# Patient Record
Sex: Female | Born: 1950
Health system: Southern US, Community
[De-identification: ages and names within clinical notes are randomized; demographics above are authoritative.]

## PROBLEM LIST (undated history)

## (undated) DIAGNOSIS — M545 Low back pain, unspecified: Secondary | ICD-10-CM

## (undated) DIAGNOSIS — E079 Disorder of thyroid, unspecified: Secondary | ICD-10-CM

## (undated) DIAGNOSIS — C4490 Unspecified malignant neoplasm of skin, unspecified: Secondary | ICD-10-CM

## (undated) DIAGNOSIS — I10 Essential (primary) hypertension: Secondary | ICD-10-CM

## (undated) DIAGNOSIS — K219 Gastro-esophageal reflux disease without esophagitis: Secondary | ICD-10-CM

## (undated) HISTORY — DX: Unspecified malignant neoplasm of skin, unspecified: C44.90

## (undated) HISTORY — PX: TUBAL LIGATION: SHX77

## (undated) HISTORY — PX: BREAST BIOPSY: SHX20

---

## 2004-02-24 ENCOUNTER — Ambulatory Visit (HOSPITAL_COMMUNITY): Admission: RE | Admit: 2004-02-24 | Discharge: 2004-02-24 | Payer: Self-pay | Admitting: Family Medicine

## 2006-04-16 ENCOUNTER — Ambulatory Visit (HOSPITAL_COMMUNITY): Admission: RE | Admit: 2006-04-16 | Discharge: 2006-04-16 | Payer: Self-pay | Admitting: Internal Medicine

## 2006-12-26 ENCOUNTER — Ambulatory Visit (HOSPITAL_COMMUNITY): Admission: RE | Admit: 2006-12-26 | Discharge: 2006-12-26 | Payer: Self-pay | Admitting: Family Medicine

## 2007-12-01 ENCOUNTER — Ambulatory Visit (HOSPITAL_COMMUNITY): Admission: RE | Admit: 2007-12-01 | Discharge: 2007-12-01 | Payer: Self-pay | Admitting: Family Medicine

## 2008-06-24 ENCOUNTER — Ambulatory Visit (HOSPITAL_COMMUNITY): Admission: RE | Admit: 2008-06-24 | Discharge: 2008-06-24 | Payer: Self-pay | Admitting: Family Medicine

## 2009-09-01 ENCOUNTER — Inpatient Hospital Stay (HOSPITAL_COMMUNITY): Admission: AD | Admit: 2009-09-01 | Discharge: 2009-09-02 | Payer: Self-pay | Admitting: Internal Medicine

## 2009-09-01 ENCOUNTER — Ambulatory Visit: Payer: Self-pay | Admitting: Gastroenterology

## 2009-09-02 ENCOUNTER — Ambulatory Visit: Payer: Self-pay | Admitting: Gastroenterology

## 2009-09-02 ENCOUNTER — Encounter: Payer: Self-pay | Admitting: Gastroenterology

## 2009-09-30 ENCOUNTER — Encounter: Payer: Self-pay | Admitting: Gastroenterology

## 2009-10-03 ENCOUNTER — Encounter (INDEPENDENT_AMBULATORY_CARE_PROVIDER_SITE_OTHER): Payer: Self-pay | Admitting: *Deleted

## 2009-11-06 ENCOUNTER — Encounter: Payer: Self-pay | Admitting: Gastroenterology

## 2011-03-12 LAB — CBC
HCT: 24.5 % — ABNORMAL LOW (ref 36.0–46.0)
Hemoglobin: 8.4 g/dL — ABNORMAL LOW (ref 12.0–15.0)
Hemoglobin: 8.6 g/dL — ABNORMAL LOW (ref 12.0–15.0)
Hemoglobin: 8.9 g/dL — ABNORMAL LOW (ref 12.0–15.0)
Hemoglobin: 9.1 g/dL — ABNORMAL LOW (ref 12.0–15.0)
MCHC: 34.7 g/dL (ref 30.0–36.0)
MCHC: 35.2 g/dL (ref 30.0–36.0)
MCV: 90.7 fL (ref 78.0–100.0)
Platelets: 296 10*3/uL (ref 150–400)
RBC: 2.72 MIL/uL — ABNORMAL LOW (ref 3.87–5.11)
RDW: 15.3 % (ref 11.5–15.5)
RDW: 15.5 % (ref 11.5–15.5)
RDW: 15.8 % — ABNORMAL HIGH (ref 11.5–15.5)
WBC: 7.6 10*3/uL (ref 4.0–10.5)
WBC: 9 10*3/uL (ref 4.0–10.5)
WBC: 9.1 10*3/uL (ref 4.0–10.5)

## 2011-03-12 LAB — CROSSMATCH

## 2011-03-12 LAB — DIFFERENTIAL
Basophils Absolute: 0.1 10*3/uL (ref 0.0–0.1)
Basophils Absolute: 0.1 10*3/uL (ref 0.0–0.1)
Basophils Absolute: 0.1 10*3/uL (ref 0.0–0.1)
Basophils Absolute: 0.1 10*3/uL (ref 0.0–0.1)
Basophils Relative: 1 % (ref 0–1)
Eosinophils Absolute: 0.3 10*3/uL (ref 0.0–0.7)
Eosinophils Absolute: 0.3 10*3/uL (ref 0.0–0.7)
Eosinophils Relative: 4 % (ref 0–5)
Eosinophils Relative: 5 % (ref 0–5)
Lymphocytes Relative: 25 % (ref 12–46)
Lymphocytes Relative: 31 % (ref 12–46)
Lymphocytes Relative: 33 % (ref 12–46)
Lymphs Abs: 1.9 10*3/uL (ref 0.7–4.0)
Lymphs Abs: 2.3 10*3/uL (ref 0.7–4.0)
Lymphs Abs: 2.3 10*3/uL (ref 0.7–4.0)
Lymphs Abs: 2.3 10*3/uL (ref 0.7–4.0)
Monocytes Absolute: 0.3 10*3/uL (ref 0.1–1.0)
Monocytes Relative: 6 % (ref 3–12)
Monocytes Relative: 6 % (ref 3–12)
Neutro Abs: 3.3 10*3/uL (ref 1.7–7.7)
Neutro Abs: 4.3 10*3/uL (ref 1.7–7.7)
Neutro Abs: 5.9 10*3/uL (ref 1.7–7.7)
Neutrophils Relative %: 57 % (ref 43–77)

## 2011-03-12 LAB — BASIC METABOLIC PANEL
BUN: 11 mg/dL (ref 6–23)
CO2: 25 mEq/L (ref 19–32)
Calcium: 8.5 mg/dL (ref 8.4–10.5)
Calcium: 8.6 mg/dL (ref 8.4–10.5)
Chloride: 108 mEq/L (ref 96–112)
Creatinine, Ser: 0.93 mg/dL (ref 0.4–1.2)
Creatinine, Ser: 1.02 mg/dL (ref 0.4–1.2)
GFR calc Af Amer: >60 mL/min (ref >60)
GFR calc Af Amer: >60 mL/min (ref >60)
GFR calc non Af Amer: >60 mL/min (ref >60)
Glucose, Bld: 97 mg/dL (ref 70–99)
Potassium: 3.4 mEq/L — ABNORMAL LOW (ref 3.5–5.1)
Potassium: 4.2 mEq/L (ref 3.5–5.1)
Sodium: 138 mEq/L (ref 135–145)

## 2011-03-12 LAB — BRAIN NATRIURETIC PEPTIDE: Pro B Natriuretic peptide (BNP): <30 pg/mL (ref 0.0–100.0)

## 2011-03-12 LAB — ABO/RH: ABO/RH(D): A POS

## 2011-03-12 LAB — URINALYSIS, MICROSCOPIC ONLY
Glucose, UA: NEGATIVE mg/dL
Ketones, ur: NEGATIVE mg/dL
Protein, ur: NEGATIVE mg/dL
Specific Gravity, Urine: 1.015 (ref 1.005–1.030)
Urobilinogen, UA: 0.2 mg/dL (ref 0.0–1.0)
pH: 5 (ref 5.0–8.0)

## 2011-03-12 LAB — MAGNESIUM: Magnesium: 1.8 mg/dL (ref 1.5–2.5)

## 2011-03-12 LAB — CARDIAC PANEL(CRET KIN+CKTOT+MB+TROPI)
CK, MB: 1.8 ng/mL (ref 0.3–4.0)
Relative Index: INVALID (ref 0.0–2.5)
Total CK: 75 U/L (ref 7–177)

## 2011-03-12 LAB — PROTIME-INR: INR: 0.9 (ref 0.00–1.49)

## 2011-03-12 LAB — TSH: TSH: 2.366 u[IU]/mL (ref 0.350–4.500)

## 2011-07-05 ENCOUNTER — Other Ambulatory Visit: Payer: Self-pay | Admitting: Obstetrics and Gynecology

## 2011-07-13 ENCOUNTER — Other Ambulatory Visit: Payer: Self-pay | Admitting: Obstetrics and Gynecology

## 2011-07-13 DIAGNOSIS — R928 Other abnormal and inconclusive findings on diagnostic imaging of breast: Secondary | ICD-10-CM

## 2011-07-20 ENCOUNTER — Ambulatory Visit
Admission: RE | Admit: 2011-07-20 | Discharge: 2011-07-20 | Disposition: A | Discharge status: Home / Self Care | Payer: BC Managed Care – PPO | Source: Ambulatory Visit | Attending: Obstetrics and Gynecology | Admitting: Obstetrics and Gynecology

## 2011-07-20 DIAGNOSIS — R928 Other abnormal and inconclusive findings on diagnostic imaging of breast: Secondary | ICD-10-CM

## 2011-12-21 ENCOUNTER — Other Ambulatory Visit: Payer: Self-pay | Admitting: Obstetrics and Gynecology

## 2011-12-21 DIAGNOSIS — N649 Disorder of breast, unspecified: Secondary | ICD-10-CM

## 2012-01-12 ENCOUNTER — Ambulatory Visit
Admission: RE | Admit: 2012-01-12 | Discharge: 2012-01-12 | Disposition: A | Discharge status: Home / Self Care | Payer: BC Managed Care – PPO | Source: Ambulatory Visit | Attending: Obstetrics and Gynecology | Admitting: Obstetrics and Gynecology

## 2012-01-12 DIAGNOSIS — N649 Disorder of breast, unspecified: Secondary | ICD-10-CM

## 2012-01-21 ENCOUNTER — Other Ambulatory Visit: Payer: Self-pay | Admitting: Obstetrics and Gynecology

## 2012-01-21 DIAGNOSIS — N6009 Solitary cyst of unspecified breast: Secondary | ICD-10-CM

## 2012-07-20 ENCOUNTER — Ambulatory Visit
Admission: RE | Admit: 2012-07-20 | Discharge: 2012-07-20 | Disposition: A | Discharge status: Home / Self Care | Payer: BC Managed Care – PPO | Source: Ambulatory Visit | Attending: Obstetrics and Gynecology | Admitting: Obstetrics and Gynecology

## 2012-07-20 DIAGNOSIS — N6009 Solitary cyst of unspecified breast: Secondary | ICD-10-CM

## 2013-01-31 ENCOUNTER — Other Ambulatory Visit: Payer: Self-pay | Admitting: Obstetrics and Gynecology

## 2013-02-12 ENCOUNTER — Ambulatory Visit
Admission: RE | Admit: 2013-02-12 | Discharge: 2013-02-12 | Disposition: A | Discharge status: Home / Self Care | Payer: BC Managed Care – PPO | Source: Ambulatory Visit | Attending: Obstetrics and Gynecology | Admitting: Obstetrics and Gynecology

## 2013-02-12 DIAGNOSIS — N649 Disorder of breast, unspecified: Secondary | ICD-10-CM

## 2013-07-12 ENCOUNTER — Other Ambulatory Visit: Payer: Self-pay | Admitting: Internal Medicine

## 2013-07-12 DIAGNOSIS — N63 Unspecified lump in unspecified breast: Secondary | ICD-10-CM

## 2013-07-27 ENCOUNTER — Ambulatory Visit
Admission: RE | Admit: 2013-07-27 | Discharge: 2013-07-27 | Disposition: A | Discharge status: Home / Self Care | Payer: BC Managed Care – PPO | Source: Ambulatory Visit | Attending: Internal Medicine | Admitting: Internal Medicine

## 2013-07-27 DIAGNOSIS — N63 Unspecified lump in unspecified breast: Secondary | ICD-10-CM

## 2014-05-10 ENCOUNTER — Other Ambulatory Visit: Payer: Self-pay

## 2014-05-10 DIAGNOSIS — Z1231 Encounter for screening mammogram for malignant neoplasm of breast: Secondary | ICD-10-CM

## 2014-08-02 ENCOUNTER — Encounter (INDEPENDENT_AMBULATORY_CARE_PROVIDER_SITE_OTHER): Payer: Self-pay

## 2014-08-02 ENCOUNTER — Ambulatory Visit
Admission: RE | Admit: 2014-08-02 | Discharge: 2014-08-02 | Disposition: A | Discharge status: Home / Self Care | Payer: BC Managed Care – PPO | Source: Ambulatory Visit

## 2014-08-02 DIAGNOSIS — Z1231 Encounter for screening mammogram for malignant neoplasm of breast: Secondary | ICD-10-CM

## 2014-08-05 ENCOUNTER — Other Ambulatory Visit: Payer: Self-pay | Admitting: Internal Medicine

## 2014-08-05 DIAGNOSIS — R928 Other abnormal and inconclusive findings on diagnostic imaging of breast: Secondary | ICD-10-CM

## 2014-08-16 ENCOUNTER — Ambulatory Visit
Admission: RE | Admit: 2014-08-16 | Discharge: 2014-08-16 | Disposition: A | Discharge status: Home / Self Care | Payer: BC Managed Care – PPO | Source: Ambulatory Visit | Attending: Internal Medicine | Admitting: Internal Medicine

## 2014-08-16 DIAGNOSIS — R928 Other abnormal and inconclusive findings on diagnostic imaging of breast: Secondary | ICD-10-CM

## 2015-05-29 ENCOUNTER — Other Ambulatory Visit (HOSPITAL_COMMUNITY): Payer: Self-pay | Admitting: Internal Medicine

## 2015-05-29 DIAGNOSIS — R109 Unspecified abdominal pain: Secondary | ICD-10-CM

## 2015-06-02 ENCOUNTER — Ambulatory Visit (HOSPITAL_COMMUNITY)
Admission: RE | Admit: 2015-06-02 | Discharge: 2015-06-02 | Disposition: A | Discharge status: Home / Self Care | Payer: BLUE CROSS/BLUE SHIELD | Source: Ambulatory Visit | Attending: Internal Medicine | Admitting: Internal Medicine

## 2015-06-02 ENCOUNTER — Other Ambulatory Visit (HOSPITAL_COMMUNITY): Payer: Self-pay | Admitting: Internal Medicine

## 2015-06-02 ENCOUNTER — Encounter (HOSPITAL_COMMUNITY): Payer: Self-pay | Admitting: *Deleted

## 2015-06-02 ENCOUNTER — Emergency Department (HOSPITAL_COMMUNITY)
Admission: EM | Admit: 2015-06-02 | Discharge: 2015-06-02 | Discharge status: Against Medical Advice | Payer: BLUE CROSS/BLUE SHIELD | Attending: Emergency Medicine | Admitting: Emergency Medicine

## 2015-06-02 DIAGNOSIS — I10 Essential (primary) hypertension: Secondary | ICD-10-CM | POA: Insufficient documentation

## 2015-06-02 DIAGNOSIS — R1010 Upper abdominal pain, unspecified: Secondary | ICD-10-CM | POA: Diagnosis not present

## 2015-06-02 DIAGNOSIS — Z79899 Other long term (current) drug therapy: Secondary | ICD-10-CM | POA: Diagnosis not present

## 2015-06-02 DIAGNOSIS — E039 Hypothyroidism, unspecified: Secondary | ICD-10-CM | POA: Insufficient documentation

## 2015-06-02 DIAGNOSIS — K219 Gastro-esophageal reflux disease without esophagitis: Secondary | ICD-10-CM | POA: Diagnosis not present

## 2015-06-02 DIAGNOSIS — R109 Unspecified abdominal pain: Secondary | ICD-10-CM

## 2015-06-02 DIAGNOSIS — R932 Abnormal findings on diagnostic imaging of liver and biliary tract: Secondary | ICD-10-CM | POA: Diagnosis not present

## 2015-06-02 DIAGNOSIS — R739 Hyperglycemia, unspecified: Secondary | ICD-10-CM | POA: Diagnosis not present

## 2015-06-02 DIAGNOSIS — Z720 Tobacco use: Secondary | ICD-10-CM | POA: Insufficient documentation

## 2015-06-02 HISTORY — DX: Low back pain, unspecified: M54.50

## 2015-06-02 HISTORY — DX: Disorder of thyroid, unspecified: E07.9

## 2015-06-02 HISTORY — DX: Gastro-esophageal reflux disease without esophagitis: K21.9

## 2015-06-02 HISTORY — DX: Essential (primary) hypertension: I10

## 2015-06-02 HISTORY — DX: Low back pain: M54.5

## 2015-06-02 LAB — URINALYSIS, ROUTINE W REFLEX MICROSCOPIC
Bilirubin Urine: NEGATIVE
Ketones, ur: NEGATIVE mg/dL
Leukocytes, UA: NEGATIVE
Nitrite: NEGATIVE
Protein, ur: NEGATIVE mg/dL
SPECIFIC GRAVITY, URINE: 1.015 (ref 1.005–1.030)
Urobilinogen, UA: 0.2 mg/dL (ref 0.0–1.0)
pH: 6 (ref 5.0–8.0)

## 2015-06-02 LAB — BLOOD GAS, ARTERIAL
Acid-Base Excess: 2.1 mmol/L — ABNORMAL HIGH (ref 0.0–2.0)
BICARBONATE: 22 meq/L (ref 20.0–24.0)
DRAWN BY: 213101
FIO2: 21 %
O2 SAT: 95.9 %
PH ART: 7.401 (ref 7.350–7.450)
TCO2: 19 mmol/L (ref 0–100)
pCO2 arterial: 36.1 mmHg (ref 35.0–45.0)
pO2, Arterial: 80.5 mmHg (ref 80.0–100.0)

## 2015-06-02 LAB — COMPREHENSIVE METABOLIC PANEL
ALT: 21 U/L (ref 14–54)
ANION GAP: 16 — AB (ref 5–15)
AST: 21 U/L (ref 15–41)
Albumin: 3.9 g/dL (ref 3.5–5.0)
Alkaline Phosphatase: 106 U/L (ref 38–126)
BILIRUBIN TOTAL: 0.6 mg/dL (ref 0.3–1.2)
BUN: 23 mg/dL — ABNORMAL HIGH (ref 6–20)
CALCIUM: 9.2 mg/dL (ref 8.9–10.3)
CHLORIDE: 92 mmol/L — AB (ref 101–111)
CO2: 22 mmol/L (ref 22–32)
CREATININE: 1.6 mg/dL — AB (ref 0.44–1.00)
GFR, EST AFRICAN AMERICAN: 38 mL/min — AB (ref >60)
GFR, EST NON AFRICAN AMERICAN: 33 mL/min — AB (ref >60)
Glucose, Bld: 456 mg/dL — ABNORMAL HIGH (ref 65–99)
POTASSIUM: 3.2 mmol/L — AB (ref 3.5–5.1)
Sodium: 130 mmol/L — ABNORMAL LOW (ref 135–145)
Total Protein: 8.1 g/dL (ref 6.5–8.1)

## 2015-06-02 LAB — CBC WITH DIFFERENTIAL/PLATELET
BASOS PCT: 0 % (ref 0–1)
Basophils Absolute: 0.1 10*3/uL (ref 0.0–0.1)
EOS ABS: 0.4 10*3/uL (ref 0.0–0.7)
Eosinophils Relative: 2 % (ref 0–5)
HEMATOCRIT: 35.5 % — AB (ref 36.0–46.0)
Hemoglobin: 12.3 g/dL (ref 12.0–15.0)
LYMPHS ABS: 5.1 10*3/uL — AB (ref 0.7–4.0)
Lymphocytes Relative: 32 % (ref 12–46)
MCH: 29.9 pg (ref 26.0–34.0)
MCHC: 34.6 g/dL (ref 30.0–36.0)
MCV: 86.4 fL (ref 78.0–100.0)
MONO ABS: 0.8 10*3/uL (ref 0.1–1.0)
MONOS PCT: 5 % (ref 3–12)
Neutro Abs: 9.7 10*3/uL — ABNORMAL HIGH (ref 1.7–7.7)
Neutrophils Relative %: 61 % (ref 43–77)
PLATELETS: 359 10*3/uL (ref 150–400)
RBC: 4.11 MIL/uL (ref 3.87–5.11)
RDW: 14.1 % (ref 11.5–15.5)
WBC: 16 10*3/uL — AB (ref 4.0–10.5)

## 2015-06-02 LAB — CBG MONITORING, ED
GLUCOSE-CAPILLARY: 522 mg/dL — AB (ref 65–99)
GLUCOSE-CAPILLARY: 473 mg/dL — AB (ref 65–99)
Glucose-Capillary: 437 mg/dL — ABNORMAL HIGH (ref 65–99)

## 2015-06-02 LAB — URINE MICROSCOPIC-ADD ON

## 2015-06-02 MED ORDER — SODIUM CHLORIDE 0.9 % IV SOLN: INTRAVENOUS | Status: DC

## 2015-06-02 MED ORDER — METFORMIN HCL ER 500 MG PO TB24
ORAL_TABLET | ORAL | Status: AC
  Filled 2015-06-02: qty 1

## 2015-06-02 MED ORDER — DEXTROSE 50 % IV SOLN: 25.0000 mL | INTRAVENOUS | Status: DC | PRN

## 2015-06-02 MED ORDER — SODIUM CHLORIDE 0.9 % IV BOLUS (SEPSIS)
2000.0000 mL | Freq: Once | INTRAVENOUS | Status: AC
  Administered 2015-06-02: 2000 mL via INTRAVENOUS

## 2015-06-02 MED ORDER — DEXTROSE-NACL 5-0.45 % IV SOLN: INTRAVENOUS | Status: DC

## 2015-06-02 MED ORDER — INSULIN REGULAR BOLUS VIA INFUSION: 0.0000 [IU] | Freq: Three times a day (TID) | INTRAVENOUS | Status: DC

## 2015-06-02 MED ORDER — POTASSIUM CHLORIDE CRYS ER 20 MEQ PO TBCR
40.0000 meq | EXTENDED_RELEASE_TABLET | Freq: Once | ORAL | Status: AC
  Administered 2015-06-02: 40 meq via ORAL
  Filled 2015-06-02: qty 2

## 2015-06-02 MED ORDER — METFORMIN HCL 500 MG PO TABS: 500.0000 mg | ORAL_TABLET | Freq: Two times a day (BID) | ORAL | Status: DC

## 2015-06-02 MED ORDER — METFORMIN HCL 500 MG PO TABS
500.0000 mg | ORAL_TABLET | Freq: Once | ORAL | Status: AC
  Administered 2015-06-02: 500 mg via ORAL

## 2015-06-02 NOTE — Discharge Instructions (Signed)
°Emergency Department Resource Guide °1) Find a Doctor and Pay Out of Pocket °Although you won't have to find out who is covered by your insurance plan, it is a good idea to ask around and get recommendations. You will then need to call the office and see if the doctor you have chosen will accept you as a new patient and what types of options they offer for patients who are self-pay. Some doctors offer discounts or will set up payment plans for their patients who do not have insurance, but you will need to ask so you aren't surprised when you get to your appointment. ° °2) Contact Your Local Health Department °Not all health departments have doctors that can see patients for sick visits, but many do, so it is worth a call to see if yours does. If you don't know where your local health department is, you can check in your phone book. The CDC also has a tool to help you locate your state's health department, and many state websites also have listings of all of their local health departments. ° °3) Find a Walk-in Clinic °If your illness is not likely to be very severe or complicated, you may want to try a walk in clinic. These are popping up all over the country in pharmacies, drugstores, and shopping centers. They're usually staffed by nurse practitioners or physician assistants that have been trained to treat common illnesses and complaints. They're usually fairly quick and inexpensive. However, if you have serious medical issues or chronic medical problems, these are probably not your best option. ° °No Primary Care Doctor: °- Call [facility] at  832-8000 - they can help you locate a primary care doctor that  accepts your insurance, provides certain services, etc. °- Physician Referral Service- 1-800-533-3463 ° °Chronic Pain Problems: °Organization         Address  Phone   Notes  °[facility]  (336) 297-2271 Patients need to be referred by their primary care doctor.  ° °Medication  Assistance: °Organization         Address  Phone   Notes  °[place] [facility] [address] °[place], [place] [address] (336) 641-8030 --Must be a resident of [place] °-- Must have NO insurance coverage whatsoever (no Medicaid/ Medicare, etc.) °-- The pt. MUST have a primary care doctor that directs their care regularly and follows them in the community °  °[facility]  (866) 331-1348   °[employer]  (888) 892-1162   ° °Agencies that provide inexpensive medical care: °Organization         Address  Phone   Notes  °[facility]  (336) 832-8035   °[facility]    (336) 832-7272   °[facility] [address] °[place], [place] [address] (336) 832-4777   °[facility] [address], °[place] (336) 271-4999   °[facility]    (336) 373-0678   °[facility]    (336) 272-1050   °[facility] ° [address], [place] Phone:  (336) 832-4444, Fax:  (336) 832-4440 Hours of Operation:  9 am - 6 pm, M-F.  Also accepts Medicaid/Medicare and self-pay.  °[facility] ° [address],  Phone: (336) 832-3150, Fax: (336) 832-3151. Hours of Operation:  8:30 am - 5:30 pm, M-F.  Also accepts Medicaid and self-pay.  °[facility] [place] [address], [place] Phone: (336) 878-6027   °[facility] [address], [place], [place] (336)723-1848, Ext. 123 Mondays & Thursdays: 7-9 AM.  First 15 patients are seen on a first come, first serve basis. °  ° °Medicaid-accepting [place] Providers: ° °Organization         Address  Phone   Notes  °[facility] [address], [place] (336) 641-2100 Also accepts self-pay patients.  °[facility] [address], [place] ° (336) 856-9996   °[facility] [address], [place]  (336) 288-8857   °[facility] [address] [place] [address], [place] (336) 299-7000   °Veita Bland [address], [place]  ° (336) 373-1557 Only accepts Media Access Medicaid patients after they have their name applied to their card.  ° °Self-Pay (no insurance) in [place]: ° °Organization         Address  Phone   Notes  °Sickle Cell Patients, [facility] [address], [place] (336) 832-1970   °[facility] [address], [place] (336) 832-4400   °[facility] [place] ° [address], [place] (336) 992-4800   °[facility]/Dr. Osei-Bonsu ° [address] [place] [address], [place] or [address], [place] (336) [phone] Phone number for both [place] and [place] locations is the same.  °[facility] [address], [place] (336) 299-0000   °[facility] [address] [place] [address], [place] or [address] (336) 852-7530 °(336) 878-2260   °[facility] [address], [place] (336) 350-1642, phone; (336) 294-5005, fax Sees patients 1st and 3rd Saturday of every month.  Must not qualify for public or private insurance (i.e. Medicaid, Medicare, [place] Health Choice, Veterans' Benefits) • Household income should be no more than 200% of the poverty level •The clinic cannot treat you if you are pregnant or think you are pregnant • Sexually transmitted diseases are not treated at the clinic.  ° ° °Dental Care: °Organization         Address  Phone  Notes  °[place] [facility] [address], [place] (336) 641-6152 Accepts children up to age 21 who are enrolled in Medicaid or [place] Health Choice; pregnant women with a Medicaid card; and children who have applied for Medicaid or [place] Health Choice, but were declined, whose parents can pay a reduced fee at time of service.  °[place]  [facility] [place]  [address], [place] (336) 641-7733 Accepts children up to age 21 who are enrolled in Medicaid or [place] Health Choice; pregnant women with a Medicaid card; and children who have applied for Medicaid or [place] Health Choice, but were declined, whose parents can pay a reduced fee at time of service.  °[facility] ° [address], [place] (336) 641-4533 Patients are seen by appointment only. Walk-ins are not accepted. [facility] will see patients 18 years of age and older. °Monday - Tuesday (8am-5pm) °Most Wednesdays (8:30-5pm) °$30 per visit, cash only  °[facility] ° [address], [place] (336) 641-4533 Patients are seen by appointment only. Walk-ins are not accepted. [facility] will see patients 18 years of age and older. °One   Wednesday Evening (Monthly: Volunteer Based).  $30 per visit, cash only  °[facility]  (919) [phone] for adults; Children under age 4, call Graduate Pediatric Dentistry at ([phone]) [phone]. Children aged 4-14, please call (919) [phone] to request a pediatric application. ° Dental services are provided in all areas of dental care including fillings, crowns and bridges, complete and partial dentures, implants, gum treatment, root canals, and extractions. Preventive care is also provided. Treatment is provided to both adults and children. °Patients are selected via a lottery and there is often a waiting list. °  °[facility] [address], °[place] ° (336) 763-8833 [email or web address] °  °[facility] [address], [place], [place] (336)723-1848, Ext. 123 Second and Fourth Thursday of each month, opens at 6:30 AM; Clinic ends at 9 AM.  Patients are seen on a first-come first-served basis, and a limited number are seen during each clinic.  ° °[facility] ° [address], [place], [place] (336) [phone]    Eligibility Requirements °You must have lived in [place], or [place] counties for at least the last three months. °  You cannot be eligible for state or federal sponsored healthcare insurance, including Veterans Administration, Medicaid, or Medicare. °  You generally cannot be eligible for healthcare insurance through your employer.  °  How to apply: °Eligibility screenings are held every Tuesday and Wednesday afternoon from 1:00 pm until 4:00 pm. You do not need an appointment for the interview!  °[facility] [address], [place], [place] 336-631-2330   °[facility]  336-342-8273   °[facility]  336-703-3100   °[facility]  336-570-6415   ° °Behavioral Health Resources in the Community: °Intensive Outpatient Programs °Organization         Address  Phone  Notes  °[facility] [address], [place], [place] 336-878-6098   °[facility] [address], [place], [place] 336-832-9800   °[facility] [address], [place], [place] ° 336-882-2125   °[facility] [address],  °[place], [place] 1-800-853-5163 or 336-641-4981   °Substance Abuse Resources °Organization         Address  Phone  Notes  °[facility]  336-882-2125   °[facility]  336-784-9470   °[facility]  336-285-9073   °[facility]  336-845-3988   °Residential & Outpatient Substance Abuse Program  1-800-659-3381   °Psychological Services °Organization         Address  Phone  Notes  °[facility]  336- 832-9600   °[facility]  336- 378-7881   °[facility] [address], [place] 1-800-853-5163 or 336-641-4981   ° °Mobile Crisis Teams °Organization         Address  Phone  Notes  °[facility], Mobile Crisis Care Unit  1-877-626-1772   °Assertive °[facility] ° [address]  [place], [place] 336-834-9664   °Sharon DeEsch [address] °[place] 336-554-5454   ° °Self-Help/Support Groups °Organization         Address  Phone             Notes  °[facility]  - variety of support groups  336- 373-1402 Call for more information  °Narcotics Anonymous (NA), [facility] [address], °[place]  2 meetings at this location  ° °  Residential Treatment Programs Organization         Address  Phone  Notes  [facility] [address],    [place]  1-(574)021-1546   [facility]  [address], [place] [address], [place], [place]   [place], [place] [phone] Admissions: 8am-3pm M-F  [facility] [place] [address],    [place], [place] [phone]   [facility] [address] [place], [place], [place]   [facility] [address]  [place], [place]   [facility] Intensive Outpatient [address], [place], [place]   [facility] [place]  [place], [place] 1-662 823 1937 or (423)703-1679   [facility] [address], [place], [place] Accepts Medicaid  [facility] [place] [address],  [place] 1-3370569500 Substance Abuse/Addiction Treatment   [facility] Organization         Address  Phone  Notes  [facility]  (401)210-9461   Domenic Schwab, PhD [address] [place] [place], [place]   863-308-5371 or 207-093-3132   [facility] [place] [address] [place], [place] (640) [phone]   [facility] [address] [place], [place] [phone] Insurance/Medicaid/sponsorship through [facility] and Families [address] [place]                                    [place], [place] [phone] [place] [address], [place] [phone]    Dr. Adele Schilder  [phone]   [facility] Dept. 1) [address]  2) [place] 3)  [place] [address], [place] [phone]  [phone]   [place] [phone] or [phone] (After Hours)      Take the prescription as directed.  Follow the diabetic diet. Call your regular medical doctor tomorrow morning to schedule a follow up appointment tomorrow.  Return to the Emergency Department immediately sooner if worsening.

## 2015-06-02 NOTE — ED Provider Notes (Signed)
CSN: [id number]     Arrival date & time 06/02/15  1722 History   First MD Initiated Contact with Patient 06/02/15 2047     Chief Complaint  Patient presents with  . Hyperglycemia      HPI Pt was seen at 2105. Per pt, c/o unknown onset and persistence of constant "high blood sugar" that was noted on her labs drawn today. Pt states she was not told to fast before her blood work, so she had "eaten a biscuit and some tater tots" before having her labs drawn. Pt states she was called by her PMD this evening and told to come to the ED for evaluation for "high blood sugar" of "663." Pt states she had labs drawn last week and she was told "my sodium and potassium were low but they didn't say anything about my sugar." Denies any complaints. Denies abd pain, no N/V/D, no back pain, no CP/SOB, no fevers.    Past Medical History  Diagnosis Date  . Hypertension   . Thyroid disease     hypothyroidism  . GERD (gastroesophageal reflux disease)   . Low back pain    Past Surgical History  Procedure Laterality Date  . Tubal ligation      History  Substance Use Topics  . Smoking status: Current Every Day Smoker -- 1.00 packs/day    Types: Cigarettes  . Smokeless tobacco: Not on file  . Alcohol Use: No    Review of Systems ROS: Statement: All systems negative except as marked or noted in the HPI; Constitutional: Negative for fever and chills. ; ; Eyes: Negative for eye pain, redness and discharge. ; ; ENMT: Negative for ear pain, hoarseness, nasal congestion, sinus pressure and sore throat. ; ; Cardiovascular: Negative for chest pain, palpitations, diaphoresis, dyspnea and peripheral edema. ; ; Respiratory: Negative for cough, wheezing and stridor. ; ; Gastrointestinal: Negative for nausea, vomiting, diarrhea, abdominal pain, blood in stool, hematemesis, jaundice and rectal bleeding. . ; ; Genitourinary: Negative for dysuria, flank pain and hematuria. ; ; Musculoskeletal: Negative for back pain and  neck pain. Negative for swelling and trauma.; ; Skin: Negative for pruritus, rash, abrasions, blisters, bruising and skin lesion.; ; Neuro: Negative for headache, lightheadedness and neck stiffness. Negative for weakness, altered level of consciousness , altered mental status, extremity weakness, paresthesias, involuntary movement, seizure and syncope.      Allergies  Amlodipine and Sulfa antibiotics  Home Medications   Prior to Admission medications   Medication Sig Start Date End Date Taking? Authorizing Provider  BYSTOLIC 10 MG tablet Take 10 mg by mouth daily.  04/26/15  Yes Historical Provider, MD  ibuprofen (ADVIL,MOTRIN) 200 MG tablet Take 200 mg by mouth every 6 (six) hours as needed.   Yes Historical Provider, MD  levothyroxine (SYNTHROID, LEVOTHROID) 50 MCG tablet Take 50 mcg by mouth daily before breakfast.  03/10/15  Yes Historical Provider, MD  losartan (COZAAR) 50 MG tablet Take 50 mg by mouth daily.  05/31/15  Yes Historical Provider, MD  pantoprazole (PROTONIX) 40 MG tablet Take 40 mg by mouth daily.  05/24/15  Yes Historical Provider, MD   BP 152/83 mmHg  Pulse 87  Temp(Src) 98.6 F (37 C) (Oral)  Resp 20  Ht 5\' 1"  (1.549 m)  Wt 190 lb (86.183 kg)  BMI 35.92 kg/m2  SpO2 97% Physical Exam  2110: Physical examination:  Nursing notes reviewed; Vital signs and O2 SAT reviewed;  Constitutional: Well developed, Well nourished, Well hydrated, In no acute  distress; Head:  Normocephalic, atraumatic; Eyes: EOMI, PERRL, No scleral icterus; ENMT: Mouth and pharynx normal, Mucous membranes moist; Neck: Supple, Full range of motion, No lymphadenopathy; Cardiovascular: Regular rate and rhythm, No gallop; Respiratory: Breath sounds clear & equal bilaterally, No wheezes.  Speaking full sentences with ease, Normal respiratory effort/excursion; Chest: Nontender, Movement normal; Abdomen: Soft, Nontender, Nondistended, Normal bowel sounds; Genitourinary: No CVA tenderness; Extremities: Pulses  normal, No tenderness, No edema, No calf edema or asymmetry.; Neuro: AA&Ox3, Major CN grossly intact.  Speech clear. No gross focal motor or sensory deficits in extremities.; Skin: Color normal, Warm, Dry.   ED Course  Procedures     EKG Interpretation None      MDM  MDM Reviewed: previous chart, nursing note and vitals Reviewed previous: labs and ultrasound Interpretation: labs      Results for orders placed or performed during the hospital encounter of 06/02/15  Comprehensive metabolic panel  Result Value Ref Range   Sodium 130 (L) 135 - 145 mmol/L   Potassium 3.2 (L) 3.5 - 5.1 mmol/L   Chloride 92 (L) 101 - 111 mmol/L   CO2 22 22 - 32 mmol/L   Glucose, Bld 456 (H) 65 - 99 mg/dL   BUN 23 (H) 6 - 20 mg/dL   Creatinine, Ser 1.60 (H) 0.44 - 1.00 mg/dL   Calcium 9.2 8.9 - 10.3 mg/dL   Total Protein 8.1 6.5 - 8.1 g/dL   Albumin 3.9 3.5 - 5.0 g/dL   AST 21 15 - 41 U/L   ALT 21 14 - 54 U/L   Alkaline Phosphatase 106 38 - 126 U/L   Total Bilirubin 0.6 0.3 - 1.2 mg/dL   GFR calc non Af Amer 33 (L) >60 mL/min   GFR calc Af Amer 38 (L) >60 mL/min   Anion gap 16 (H) 5 - 15  CBC with Differential  Result Value Ref Range   WBC 16.0 (H) 4.0 - 10.5 K/uL   RBC 4.11 3.87 - 5.11 MIL/uL   Hemoglobin 12.3 12.0 - 15.0 g/dL   HCT 35.5 (L) 36.0 - 46.0 %   MCV 86.4 78.0 - 100.0 fL   MCH 29.9 26.0 - 34.0 pg   MCHC 34.6 30.0 - 36.0 g/dL   RDW 14.1 11.5 - 15.5 %   Platelets 359 150 - 400 K/uL   Neutrophils Relative % 61 43 - 77 %   Neutro Abs 9.7 (H) 1.7 - 7.7 K/uL   Lymphocytes Relative 32 12 - 46 %   Lymphs Abs 5.1 (H) 0.7 - 4.0 K/uL   Monocytes Relative 5 3 - 12 %   Monocytes Absolute 0.8 0.1 - 1.0 K/uL   Eosinophils Relative 2 0 - 5 %   Eosinophils Absolute 0.4 0.0 - 0.7 K/uL   Basophils Relative 0 0 - 1 %   Basophils Absolute 0.1 0.0 - 0.1 K/uL  Urinalysis, Routine w reflex microscopic (not at [facility])  Result Value Ref Range   Color, Urine YELLOW YELLOW   APPearance CLEAR  CLEAR   Specific Gravity, Urine 1.015 1.005 - 1.030   pH 6.0 5.0 - 8.0   Glucose, UA >1000 (A) NEGATIVE mg/dL   Hgb urine dipstick TRACE (A) NEGATIVE   Bilirubin Urine NEGATIVE NEGATIVE   Ketones, ur NEGATIVE NEGATIVE mg/dL   Protein, ur NEGATIVE NEGATIVE mg/dL   Urobilinogen, UA 0.2 0.0 - 1.0 mg/dL   Nitrite NEGATIVE NEGATIVE   Leukocytes, UA NEGATIVE NEGATIVE  Blood gas, arterial  Result Value Ref Range  FIO2 21.00 %   pH, Arterial 7.401 7.350 - 7.450   pCO2 arterial 36.1 35.0 - 45.0 mmHg   pO2, Arterial 80.5 80.0 - 100.0 mmHg   Bicarbonate 22.0 20.0 - 24.0 mEq/L   TCO2 19.0 0 - 100 mmol/L   Acid-Base Excess 2.1 (H) 0.0 - 2.0 mmol/L   O2 Saturation 95.9 %   Collection site RIGHT BRACHIAL    Drawn by [id number]    Sample type ARTERIAL    Allens test (pass/fail) NOT INDICATED (A) PASS  Urine microscopic-add on  Result Value Ref Range   Squamous Epithelial / LPF FEW (A) RARE   WBC, UA 0-2 <3 WBC/hpf   RBC / HPF 0-2 <3 RBC/hpf   Bacteria, UA RARE RARE   Urine-Other FEW YEAST   CBG monitoring, ED  Result Value Ref Range   Glucose-Capillary 522 (H) 65 - 99 mg/dL  CBG monitoring, ED  Result Value Ref Range   Glucose-Capillary 473 (H) 65 - 99 mg/dL   Comment 1 Notify RN    US Abdomen Limited Ruq 06/02/2015   CLINICAL DATA:  Upper abdominal pain, more severe postprandial  EXAM: US ABDOMEN LIMITED - RIGHT UPPER QUADRANT  COMPARISON:  None.  FINDINGS: Gallbladder:  No gallstones or wall thickening visualized. There is no pericholecystic fluid. No sonographic Murphy sign noted.  Common bile duct:  Diameter: 3 mm. There is no intrahepatic or extrahepatic biliary duct dilatation.  Liver:  No focal lesion identified. Liver echogenicity is diffusely increased.  IMPRESSION: Diffuse increase in liver echogenicity, most likely due to hepatic steatosis. While no focal liver lesions are identified, it must be cautioned that the sensitivity of ultrasound for focal liver lesions is diminished in this  circumstance. Study otherwise unremarkable.   Electronically Signed   By: Lowella Grip III M.D.   On: 06/02/2015 07:57    2240:  Na corrects to 139 for elevated glucose. Potassium repleted PO. Pt is not acidotic. IVF NS x2L given without significant improvement. IV insulin gtt ordered. Pt states she "doesn't want to stay for that stuff" and "wants to go home now." States she "just came here to get a metformin prescription and go home," and "not all this stuff."  Pt informed re: dx testing results, including continued elevated glucose, and that I recommend further treatment in the ED.  Pt refuses to stay.  I encouraged pt to stay, continues to refuse.  Pt makes her own medical decisions.  Risks of AMA explained to pt, including, but not limited to:  Renal failure, blindness, stroke, heart attack, cardiac arrythmia ("irregular heart rate/beat"), "passing out," temporary and/or permanent disability, death.  Pt verb understanding and continues to refuse further ED treatment, understanding the consequences of her decision.  I encouraged pt to follow up with her PMD tomorrow and return to the ED immediately if symptoms return, or for any other concerns.  Pt verb understanding, agreeable. Will dose metformin before dx and provide rx.     Francine Graven, DO 06/05/15 1528

## 2015-06-02 NOTE — ED Notes (Signed)
cbg of 437.

## 2015-06-02 NOTE — ED Notes (Signed)
Patient went to PCP today for labwork.  Was notified this afternoon that glucose 663.  No history of diabetes diagnosis.

## 2015-07-09 ENCOUNTER — Ambulatory Visit: Payer: BLUE CROSS/BLUE SHIELD | Admitting: Cardiology

## 2015-07-22 ENCOUNTER — Ambulatory Visit (INDEPENDENT_AMBULATORY_CARE_PROVIDER_SITE_OTHER): Payer: BLUE CROSS/BLUE SHIELD | Admitting: Cardiovascular Disease

## 2015-07-22 ENCOUNTER — Encounter: Payer: Self-pay | Admitting: Cardiovascular Disease

## 2015-07-22 VITALS — BP 130/86 | HR 92 | Ht 61.0 in | Wt 182.0 lb

## 2015-07-22 DIAGNOSIS — E785 Hyperlipidemia, unspecified: Secondary | ICD-10-CM | POA: Diagnosis not present

## 2015-07-22 DIAGNOSIS — I10 Essential (primary) hypertension: Secondary | ICD-10-CM

## 2015-07-22 DIAGNOSIS — E039 Hypothyroidism, unspecified: Secondary | ICD-10-CM

## 2015-07-22 DIAGNOSIS — Z716 Tobacco abuse counseling: Secondary | ICD-10-CM

## 2015-07-22 MED ORDER — ROSUVASTATIN CALCIUM 5 MG PO TABS: 5.0000 mg | ORAL_TABLET | Freq: Every day | ORAL | Status: DC

## 2015-07-22 NOTE — Progress Notes (Signed)
Patient ID: Gabrielle Clay, female   DOB: 08-28-1951, 64 y.o.   MRN: [id number]       CARDIOLOGY CONSULT NOTE  Patient ID: Gabrielle Clay MRN: [id number] DOB/AGE: Nov 02, 1951 64 y.o.  Admit date: (Not on file) Primary Physician Glo Herring., MD  Reason for Consultation: dyslipidemia  HPI: The patient is a 64 year old woman with a history of diabetes, hypertension, hypothyroidism, gastroesophageal reflux disease, and dyslipidemia.   A review of labs dated 05/29/15 shows hemoglobin 12.4, platelets 307, creatinine 0.82, BUN 16, sodium 124, potassium 3.3, chloride 89, bicarbonate 22, albumin 3.8, total bilirubin 0.3, AST 22, ALT 25, alkaline phosphatase 99, lipase 25.  Lipids drawn on 05/29/15 showed total cholesterol markedly elevated at 629, triglycerides 5796, HDL 7, and LDL could not be calculated. TSH 1.28.  She was prescribed Lipitor 10 mg daily in 2014 but she said she only took it for a very short while she was scared of the side effects. She believes she may have had some mild joint pains at that time. She said she is now faithfully taking fish oil twice daily and is trying to eat better and walk. 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 She denies chest pain, shortness of breath, palpitations, leg swelling, orthopnea, and paroxysmal nocturnal dyspnea.  Soc: Smokes 1 ppd. 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  Fam: Mother had CVA in 58's.    Allergies  Allergen Reactions  . Amlodipine Swelling    Swelling of feet  . Sulfa Antibiotics Nausea Only    Current Outpatient Prescriptions  Medication Sig Dispense Refill  . aspirin 81 MG tablet Take 81 mg by mouth daily.    . Canagliflozin-Metformin HCl [id number] MG TABS Take 150-1,000 mg by mouth 2 (two) times daily.    Marland Kitchen levothyroxine (SYNTHROID, LEVOTHROID) 50 MCG tablet Take 50 mcg by mouth daily before breakfast.   2  . losartan (COZAAR) 50 MG tablet Take 50 mg by mouth daily.   2  . Omega-3 Fatty Acids (FISH  OIL) 1000 MG CAPS Take 1,000 mg by mouth 2 (two) times daily.    . pantoprazole (PROTONIX) 40 MG tablet Take 40 mg by mouth daily.   2  . BYSTOLIC 10 MG tablet Take 10 mg by mouth daily.   2  . ibuprofen (ADVIL,MOTRIN) 200 MG tablet Take 200 mg by mouth every 6 (six) hours as needed.     No current facility-administered medications for this visit.    Past Medical History  Diagnosis Date  . Hypertension   . Thyroid disease     hypothyroidism  . GERD (gastroesophageal reflux disease)   . Low back pain     Past Surgical History  Procedure Laterality Date  . Tubal ligation      Social History   Social History  . Marital Status: Divorced    Spouse Name: N/A  . Number of Children: N/A  . Years of Education: N/A   Occupational History  . Not on file.   Social History Main Topics  . Smoking status: Current Every Day Smoker -- 1.00 packs/day    Types: Cigarettes  . Smokeless tobacco: Not on file  . Alcohol Use: No  . Drug Use: No  . Sexual Activity: Not on file   Other Topics Concern  . Not on file   Social History Narrative       Prior to Admission medications   Medication Sig Start Date End Date Taking? Authorizing Provider  BYSTOLIC 10  MG tablet Take 10 mg by mouth daily.  04/26/15   Historical Provider, MD  ibuprofen (ADVIL,MOTRIN) 200 MG tablet Take 200 mg by mouth every 6 (six) hours as needed.    Historical Provider, MD  levothyroxine (SYNTHROID, LEVOTHROID) 50 MCG tablet Take 50 mcg by mouth daily before breakfast.  03/10/15   Historical Provider, MD  losartan (COZAAR) 50 MG tablet Take 50 mg by mouth daily.  05/31/15   Historical Provider, MD  metFORMIN (GLUCOPHAGE) 500 MG tablet Take 1 tablet (500 mg total) by mouth 2 (two) times daily with a meal. 06/02/15   Francine Graven, DO  pantoprazole (PROTONIX) 40 MG tablet Take 40 mg by mouth daily.  05/24/15   Historical Provider, MD     Review of systems complete and found to be negative unless listed above in  HPI     Physical exam Blood pressure 130/86, pulse 92, height 5\' 1"  (1.549 m), weight 182 lb (82.555 kg), SpO2 99 %. General: NAD Neck: No JVD, no thyromegaly or thyroid nodule.  Lungs: Clear to auscultation bilaterally with normal respiratory effort. CV: Nondisplaced PMI. Regular rate and rhythm, normal S1/S2, no S3/S4, no murmur.  No peripheral edema. Normal pedal pulses.  Abdomen: Soft, nontender, obese, no distention.  Skin: Intact without lesions or rashes.  Neurologic: Alert and oriented x 3.  Psych: Normal affect. Extremities: No clubbing or cyanosis.  HEENT: Normal.     Labs:   Lab Results  Component Value Date   WBC 16.0* 06/02/2015   HGB 12.3 06/02/2015   HCT 35.5* 06/02/2015   MCV 86.4 06/02/2015   PLT 359 06/02/2015   No results for input(s): NA, K, CL, CO2, BUN, CREATININE, CALCIUM, PROT, BILITOT, ALKPHOS, ALT, AST, GLUCOSE in the last 168 hours.  Invalid input(s): LABALBU Lab Results  Component Value Date   CKTOTAL 75 09/01/2009   CKMB 1.8 09/01/2009   TROPONINI 0.04        NO INDICATION OF MYOCARDIAL INJURY. 09/01/2009   No results found for: CHOL No results found for: HDL No results found for: LDLCALC No results found for: TRIG No results found for: CHOLHDL No results found for: LDLDIRECT       Studies: No results found.  ASSESSMENT AND PLAN:  1. Severe dyslipidemia: Currently on fish oil 1000 mg bid. Previously unwilling to continue statin therapy but amenable now. Will start with very low dose Crestor 5 mg daily and repeat lipids in 2 months. Risk factor modification encouraged including weight loss through diet and exercise.  2. Essential HTN: Reasonably controlled. No changes.  3. Hypothyoidism: TSH normal noted above. No changes to therapy.  4. Tobacco abuse: Cessation counseling provided (3 minutes).  Dispo: f/u 3 months.   Signed: Kate Sable, M.D., F.A.C.C.  07/22/2015, 8:52 AM

## 2015-07-22 NOTE — Patient Instructions (Signed)
Your physician recommends that you schedule a follow-up appointment in: 3 months with Dr Bronson Ing    Start Crestor 5 mg daily   Get FASTING lab work in 2 months    Thank you for choosing [place] !

## 2015-09-06 LAB — LIPID PANEL
CHOLESTEROL: 103 mg/dL — AB (ref 125–200)
HDL: 22 mg/dL — ABNORMAL LOW (ref >=46)
LDL Cholesterol: 13 mg/dL (ref <130)
TRIGLYCERIDES: 340 mg/dL — AB (ref <150)
Total CHOL/HDL Ratio: 4.7 Ratio (ref <=5.0)
VLDL: 68 mg/dL — AB (ref <30)

## 2015-09-30 ENCOUNTER — Other Ambulatory Visit: Payer: Self-pay

## 2015-09-30 DIAGNOSIS — Z1231 Encounter for screening mammogram for malignant neoplasm of breast: Secondary | ICD-10-CM

## 2015-11-07 ENCOUNTER — Ambulatory Visit
Admission: RE | Admit: 2015-11-07 | Discharge: 2015-11-07 | Disposition: A | Discharge status: Home / Self Care | Payer: BLUE CROSS/BLUE SHIELD | Source: Ambulatory Visit

## 2015-11-07 DIAGNOSIS — Z1231 Encounter for screening mammogram for malignant neoplasm of breast: Secondary | ICD-10-CM

## 2015-11-10 ENCOUNTER — Other Ambulatory Visit: Payer: Self-pay | Admitting: Family Medicine

## 2015-11-10 DIAGNOSIS — N6011 Diffuse cystic mastopathy of right breast: Secondary | ICD-10-CM

## 2015-12-26 ENCOUNTER — Other Ambulatory Visit: Payer: BLUE CROSS/BLUE SHIELD

## 2017-06-28 ENCOUNTER — Other Ambulatory Visit (HOSPITAL_COMMUNITY): Payer: Self-pay | Admitting: Internal Medicine

## 2017-06-28 DIAGNOSIS — Z1231 Encounter for screening mammogram for malignant neoplasm of breast: Secondary | ICD-10-CM

## 2017-08-25 ENCOUNTER — Other Ambulatory Visit (HOSPITAL_COMMUNITY): Payer: Self-pay | Admitting: Nephrology

## 2017-08-25 DIAGNOSIS — N183 Chronic kidney disease, stage 3 unspecified: Secondary | ICD-10-CM

## 2017-09-09 ENCOUNTER — Ambulatory Visit (HOSPITAL_COMMUNITY)
Admission: RE | Admit: 2017-09-09 | Discharge: 2017-09-09 | Disposition: A | Discharge status: Home / Self Care | Payer: BLUE CROSS/BLUE SHIELD | Source: Ambulatory Visit | Attending: Nephrology | Admitting: Nephrology

## 2017-09-09 DIAGNOSIS — N261 Atrophy of kidney (terminal): Secondary | ICD-10-CM | POA: Insufficient documentation

## 2017-09-09 DIAGNOSIS — N281 Cyst of kidney, acquired: Secondary | ICD-10-CM | POA: Diagnosis not present

## 2017-09-09 DIAGNOSIS — N183 Chronic kidney disease, stage 3 unspecified: Secondary | ICD-10-CM

## 2017-11-04 ENCOUNTER — Other Ambulatory Visit (HOSPITAL_COMMUNITY): Payer: Self-pay | Admitting: Internal Medicine

## 2017-11-04 DIAGNOSIS — Z09 Encounter for follow-up examination after completed treatment for conditions other than malignant neoplasm: Secondary | ICD-10-CM

## 2017-11-04 DIAGNOSIS — N6310 Unspecified lump in the right breast, unspecified quadrant: Secondary | ICD-10-CM

## 2017-12-07 DIAGNOSIS — N183 Chronic kidney disease, stage 3 (moderate): Secondary | ICD-10-CM | POA: Diagnosis not present

## 2017-12-07 DIAGNOSIS — D509 Iron deficiency anemia, unspecified: Secondary | ICD-10-CM | POA: Diagnosis not present

## 2017-12-07 DIAGNOSIS — I10 Essential (primary) hypertension: Secondary | ICD-10-CM | POA: Diagnosis not present

## 2017-12-07 DIAGNOSIS — R809 Proteinuria, unspecified: Secondary | ICD-10-CM | POA: Diagnosis not present

## 2017-12-07 DIAGNOSIS — Z79899 Other long term (current) drug therapy: Secondary | ICD-10-CM | POA: Diagnosis not present

## 2017-12-07 DIAGNOSIS — E559 Vitamin D deficiency, unspecified: Secondary | ICD-10-CM | POA: Diagnosis not present

## 2017-12-13 ENCOUNTER — Encounter (HOSPITAL_COMMUNITY): Payer: Self-pay

## 2017-12-13 ENCOUNTER — Encounter (HOSPITAL_COMMUNITY): Payer: BLUE CROSS/BLUE SHIELD

## 2017-12-14 DIAGNOSIS — E669 Obesity, unspecified: Secondary | ICD-10-CM | POA: Diagnosis not present

## 2017-12-14 DIAGNOSIS — R809 Proteinuria, unspecified: Secondary | ICD-10-CM | POA: Diagnosis not present

## 2017-12-14 DIAGNOSIS — N183 Chronic kidney disease, stage 3 (moderate): Secondary | ICD-10-CM | POA: Diagnosis not present

## 2017-12-14 DIAGNOSIS — E559 Vitamin D deficiency, unspecified: Secondary | ICD-10-CM | POA: Diagnosis not present

## 2017-12-22 DIAGNOSIS — Z1389 Encounter for screening for other disorder: Secondary | ICD-10-CM | POA: Diagnosis not present

## 2017-12-22 DIAGNOSIS — E063 Autoimmune thyroiditis: Secondary | ICD-10-CM | POA: Diagnosis not present

## 2017-12-22 DIAGNOSIS — E119 Type 2 diabetes mellitus without complications: Secondary | ICD-10-CM | POA: Diagnosis not present

## 2017-12-22 DIAGNOSIS — E6609 Other obesity due to excess calories: Secondary | ICD-10-CM | POA: Diagnosis not present

## 2017-12-22 DIAGNOSIS — N183 Chronic kidney disease, stage 3 (moderate): Secondary | ICD-10-CM | POA: Diagnosis not present

## 2017-12-22 DIAGNOSIS — I10 Essential (primary) hypertension: Secondary | ICD-10-CM | POA: Diagnosis not present

## 2017-12-22 DIAGNOSIS — Z6834 Body mass index (BMI) 34.0-34.9, adult: Secondary | ICD-10-CM | POA: Diagnosis not present

## 2017-12-22 DIAGNOSIS — K219 Gastro-esophageal reflux disease without esophagitis: Secondary | ICD-10-CM | POA: Diagnosis not present

## 2018-01-10 ENCOUNTER — Other Ambulatory Visit: Payer: Self-pay

## 2018-01-10 NOTE — Patient Outreach (Signed)
[place] [facility]) Care Management  01/10/2018  Gabrielle Clay [birth year] [id number]   Telephone call to review health risk assessment and screen for care management needs for  [employer]. Member states she is doing well and her diabetes and blood pressure are well controlled.  Denies any case management needs at this time. Member assessed with no further interventions needed. Successful outreach letter sent. Peter Garter RN, [facility] Care Management Coordinator [facility] Care Management [phone]

## 2018-01-19 DIAGNOSIS — I10 Essential (primary) hypertension: Secondary | ICD-10-CM | POA: Diagnosis not present

## 2018-01-19 DIAGNOSIS — E6609 Other obesity due to excess calories: Secondary | ICD-10-CM | POA: Diagnosis not present

## 2018-01-19 DIAGNOSIS — Z6834 Body mass index (BMI) 34.0-34.9, adult: Secondary | ICD-10-CM | POA: Diagnosis not present

## 2018-01-19 DIAGNOSIS — E1129 Type 2 diabetes mellitus with other diabetic kidney complication: Secondary | ICD-10-CM | POA: Diagnosis not present

## 2018-01-19 DIAGNOSIS — N183 Chronic kidney disease, stage 3 (moderate): Secondary | ICD-10-CM | POA: Diagnosis not present

## 2018-01-19 DIAGNOSIS — E782 Mixed hyperlipidemia: Secondary | ICD-10-CM | POA: Diagnosis not present

## 2018-01-19 DIAGNOSIS — Z1389 Encounter for screening for other disorder: Secondary | ICD-10-CM | POA: Diagnosis not present

## 2018-01-19 DIAGNOSIS — Z0001 Encounter for general adult medical examination with abnormal findings: Secondary | ICD-10-CM | POA: Diagnosis not present

## 2018-04-07 DIAGNOSIS — N183 Chronic kidney disease, stage 3 (moderate): Secondary | ICD-10-CM | POA: Diagnosis not present

## 2018-04-07 DIAGNOSIS — R809 Proteinuria, unspecified: Secondary | ICD-10-CM | POA: Diagnosis not present

## 2018-04-07 DIAGNOSIS — Z79899 Other long term (current) drug therapy: Secondary | ICD-10-CM | POA: Diagnosis not present

## 2018-04-07 DIAGNOSIS — E559 Vitamin D deficiency, unspecified: Secondary | ICD-10-CM | POA: Diagnosis not present

## 2018-04-07 DIAGNOSIS — D509 Iron deficiency anemia, unspecified: Secondary | ICD-10-CM | POA: Diagnosis not present

## 2018-04-07 DIAGNOSIS — I10 Essential (primary) hypertension: Secondary | ICD-10-CM | POA: Diagnosis not present

## 2018-04-12 DIAGNOSIS — I10 Essential (primary) hypertension: Secondary | ICD-10-CM | POA: Diagnosis not present

## 2018-04-12 DIAGNOSIS — D509 Iron deficiency anemia, unspecified: Secondary | ICD-10-CM | POA: Diagnosis not present

## 2018-04-12 DIAGNOSIS — N183 Chronic kidney disease, stage 3 (moderate): Secondary | ICD-10-CM | POA: Diagnosis not present

## 2018-04-12 DIAGNOSIS — E1129 Type 2 diabetes mellitus with other diabetic kidney complication: Secondary | ICD-10-CM | POA: Diagnosis not present

## 2018-04-12 DIAGNOSIS — R809 Proteinuria, unspecified: Secondary | ICD-10-CM | POA: Diagnosis not present

## 2018-04-12 DIAGNOSIS — E559 Vitamin D deficiency, unspecified: Secondary | ICD-10-CM | POA: Diagnosis not present

## 2018-05-04 DIAGNOSIS — E1129 Type 2 diabetes mellitus with other diabetic kidney complication: Secondary | ICD-10-CM | POA: Diagnosis not present

## 2018-05-04 DIAGNOSIS — I10 Essential (primary) hypertension: Secondary | ICD-10-CM | POA: Diagnosis not present

## 2018-05-04 DIAGNOSIS — F17290 Nicotine dependence, other tobacco product, uncomplicated: Secondary | ICD-10-CM | POA: Diagnosis not present

## 2018-05-04 DIAGNOSIS — Z6835 Body mass index (BMI) 35.0-35.9, adult: Secondary | ICD-10-CM | POA: Diagnosis not present

## 2018-05-04 DIAGNOSIS — E782 Mixed hyperlipidemia: Secondary | ICD-10-CM | POA: Diagnosis not present

## 2018-05-04 DIAGNOSIS — Z1389 Encounter for screening for other disorder: Secondary | ICD-10-CM | POA: Diagnosis not present

## 2018-07-14 DIAGNOSIS — I10 Essential (primary) hypertension: Secondary | ICD-10-CM | POA: Diagnosis not present

## 2018-07-14 DIAGNOSIS — Z79899 Other long term (current) drug therapy: Secondary | ICD-10-CM | POA: Diagnosis not present

## 2018-07-14 DIAGNOSIS — N183 Chronic kidney disease, stage 3 (moderate): Secondary | ICD-10-CM | POA: Diagnosis not present

## 2018-07-26 DIAGNOSIS — E669 Obesity, unspecified: Secondary | ICD-10-CM | POA: Diagnosis not present

## 2018-07-26 DIAGNOSIS — I10 Essential (primary) hypertension: Secondary | ICD-10-CM | POA: Diagnosis not present

## 2018-07-26 DIAGNOSIS — E559 Vitamin D deficiency, unspecified: Secondary | ICD-10-CM | POA: Diagnosis not present

## 2018-07-26 DIAGNOSIS — N183 Chronic kidney disease, stage 3 (moderate): Secondary | ICD-10-CM | POA: Diagnosis not present

## 2018-12-22 DIAGNOSIS — E119 Type 2 diabetes mellitus without complications: Secondary | ICD-10-CM | POA: Diagnosis not present

## 2018-12-22 DIAGNOSIS — H40033 Anatomical narrow angle, bilateral: Secondary | ICD-10-CM | POA: Diagnosis not present

## 2019-01-03 DIAGNOSIS — E063 Autoimmune thyroiditis: Secondary | ICD-10-CM | POA: Diagnosis not present

## 2019-01-03 DIAGNOSIS — E782 Mixed hyperlipidemia: Secondary | ICD-10-CM | POA: Diagnosis not present

## 2019-01-03 DIAGNOSIS — I10 Essential (primary) hypertension: Secondary | ICD-10-CM | POA: Diagnosis not present

## 2019-01-03 DIAGNOSIS — L719 Rosacea, unspecified: Secondary | ICD-10-CM | POA: Diagnosis not present

## 2019-01-03 DIAGNOSIS — Z0001 Encounter for general adult medical examination with abnormal findings: Secondary | ICD-10-CM | POA: Diagnosis not present

## 2019-01-03 DIAGNOSIS — K219 Gastro-esophageal reflux disease without esophagitis: Secondary | ICD-10-CM | POA: Diagnosis not present

## 2019-01-03 DIAGNOSIS — E7849 Other hyperlipidemia: Secondary | ICD-10-CM | POA: Diagnosis not present

## 2019-01-03 DIAGNOSIS — Z7689 Persons encountering health services in other specified circumstances: Secondary | ICD-10-CM | POA: Diagnosis not present

## 2019-01-03 DIAGNOSIS — E119 Type 2 diabetes mellitus without complications: Secondary | ICD-10-CM | POA: Diagnosis not present

## 2019-01-03 DIAGNOSIS — E1165 Type 2 diabetes mellitus with hyperglycemia: Secondary | ICD-10-CM | POA: Diagnosis not present

## 2019-01-03 DIAGNOSIS — Z6837 Body mass index (BMI) 37.0-37.9, adult: Secondary | ICD-10-CM | POA: Diagnosis not present

## 2019-01-04 ENCOUNTER — Other Ambulatory Visit (HOSPITAL_COMMUNITY): Payer: Self-pay | Admitting: Internal Medicine

## 2019-01-04 DIAGNOSIS — N6310 Unspecified lump in the right breast, unspecified quadrant: Secondary | ICD-10-CM

## 2019-01-04 DIAGNOSIS — Z1239 Encounter for other screening for malignant neoplasm of breast: Secondary | ICD-10-CM

## 2019-01-04 DIAGNOSIS — E782 Mixed hyperlipidemia: Secondary | ICD-10-CM | POA: Diagnosis not present

## 2019-01-04 DIAGNOSIS — Z09 Encounter for follow-up examination after completed treatment for conditions other than malignant neoplasm: Secondary | ICD-10-CM

## 2019-01-04 DIAGNOSIS — E7849 Other hyperlipidemia: Secondary | ICD-10-CM | POA: Diagnosis not present

## 2019-01-04 DIAGNOSIS — E063 Autoimmune thyroiditis: Secondary | ICD-10-CM | POA: Diagnosis not present

## 2019-01-04 DIAGNOSIS — E119 Type 2 diabetes mellitus without complications: Secondary | ICD-10-CM | POA: Diagnosis not present

## 2019-01-04 DIAGNOSIS — E2839 Other primary ovarian failure: Secondary | ICD-10-CM

## 2019-01-04 DIAGNOSIS — Z0001 Encounter for general adult medical examination with abnormal findings: Secondary | ICD-10-CM | POA: Diagnosis not present

## 2019-01-11 DIAGNOSIS — R809 Proteinuria, unspecified: Secondary | ICD-10-CM | POA: Diagnosis not present

## 2019-01-11 DIAGNOSIS — E559 Vitamin D deficiency, unspecified: Secondary | ICD-10-CM | POA: Diagnosis not present

## 2019-01-11 DIAGNOSIS — E875 Hyperkalemia: Secondary | ICD-10-CM | POA: Diagnosis not present

## 2019-01-11 DIAGNOSIS — Z79899 Other long term (current) drug therapy: Secondary | ICD-10-CM | POA: Diagnosis not present

## 2019-01-11 DIAGNOSIS — I10 Essential (primary) hypertension: Secondary | ICD-10-CM | POA: Diagnosis not present

## 2019-01-11 DIAGNOSIS — D509 Iron deficiency anemia, unspecified: Secondary | ICD-10-CM | POA: Diagnosis not present

## 2019-01-11 DIAGNOSIS — N183 Chronic kidney disease, stage 3 (moderate): Secondary | ICD-10-CM | POA: Diagnosis not present

## 2019-01-17 ENCOUNTER — Other Ambulatory Visit (HOSPITAL_COMMUNITY): Payer: Self-pay | Admitting: Internal Medicine

## 2019-01-17 DIAGNOSIS — R809 Proteinuria, unspecified: Secondary | ICD-10-CM | POA: Diagnosis not present

## 2019-01-17 DIAGNOSIS — E559 Vitamin D deficiency, unspecified: Secondary | ICD-10-CM | POA: Diagnosis not present

## 2019-01-17 DIAGNOSIS — E669 Obesity, unspecified: Secondary | ICD-10-CM | POA: Diagnosis not present

## 2019-01-17 DIAGNOSIS — N63 Unspecified lump in unspecified breast: Secondary | ICD-10-CM

## 2019-01-17 DIAGNOSIS — N183 Chronic kidney disease, stage 3 (moderate): Secondary | ICD-10-CM | POA: Diagnosis not present

## 2019-01-25 ENCOUNTER — Ambulatory Visit (HOSPITAL_COMMUNITY)
Admission: RE | Admit: 2019-01-25 | Discharge: 2019-01-25 | Disposition: A | Discharge status: Home / Self Care | Payer: PPO | Source: Ambulatory Visit | Attending: Internal Medicine | Admitting: Internal Medicine

## 2019-01-25 ENCOUNTER — Ambulatory Visit (HOSPITAL_COMMUNITY): Payer: PPO

## 2019-01-25 ENCOUNTER — Encounter (HOSPITAL_COMMUNITY): Payer: Self-pay

## 2019-01-25 DIAGNOSIS — N6310 Unspecified lump in the right breast, unspecified quadrant: Secondary | ICD-10-CM

## 2019-01-25 DIAGNOSIS — E2839 Other primary ovarian failure: Secondary | ICD-10-CM | POA: Insufficient documentation

## 2019-01-25 DIAGNOSIS — M85852 Other specified disorders of bone density and structure, left thigh: Secondary | ICD-10-CM | POA: Diagnosis not present

## 2019-01-25 DIAGNOSIS — Z09 Encounter for follow-up examination after completed treatment for conditions other than malignant neoplasm: Secondary | ICD-10-CM

## 2019-01-25 DIAGNOSIS — R922 Inconclusive mammogram: Secondary | ICD-10-CM | POA: Diagnosis not present

## 2019-01-25 DIAGNOSIS — Z780 Asymptomatic menopausal state: Secondary | ICD-10-CM | POA: Diagnosis not present

## 2019-01-30 ENCOUNTER — Other Ambulatory Visit (HOSPITAL_COMMUNITY): Payer: Self-pay

## 2019-05-10 DIAGNOSIS — E1129 Type 2 diabetes mellitus with other diabetic kidney complication: Secondary | ICD-10-CM | POA: Diagnosis not present

## 2019-05-10 DIAGNOSIS — D485 Neoplasm of uncertain behavior of skin: Secondary | ICD-10-CM | POA: Diagnosis not present

## 2019-05-10 DIAGNOSIS — Z1389 Encounter for screening for other disorder: Secondary | ICD-10-CM | POA: Diagnosis not present

## 2019-05-10 DIAGNOSIS — L259 Unspecified contact dermatitis, unspecified cause: Secondary | ICD-10-CM | POA: Diagnosis not present

## 2019-05-10 DIAGNOSIS — Z6837 Body mass index (BMI) 37.0-37.9, adult: Secondary | ICD-10-CM | POA: Diagnosis not present

## 2019-05-10 DIAGNOSIS — I10 Essential (primary) hypertension: Secondary | ICD-10-CM | POA: Diagnosis not present

## 2019-08-28 IMAGING — MG DIGITAL DIAGNOSTIC BILATERAL MAMMOGRAM WITH TOMO AND CAD
6 of 10 series · 6 of 30 positions shown · non-contrast
Comparison: Previous exam(s).

CLINICAL DATA: Delayed screening recall for a possible right mass.

EXAM:
DIGITAL DIAGNOSTIC BILATERAL MAMMOGRAM WITH CAD AND TOMO

[R MLO synth-2D (1 of 2)]
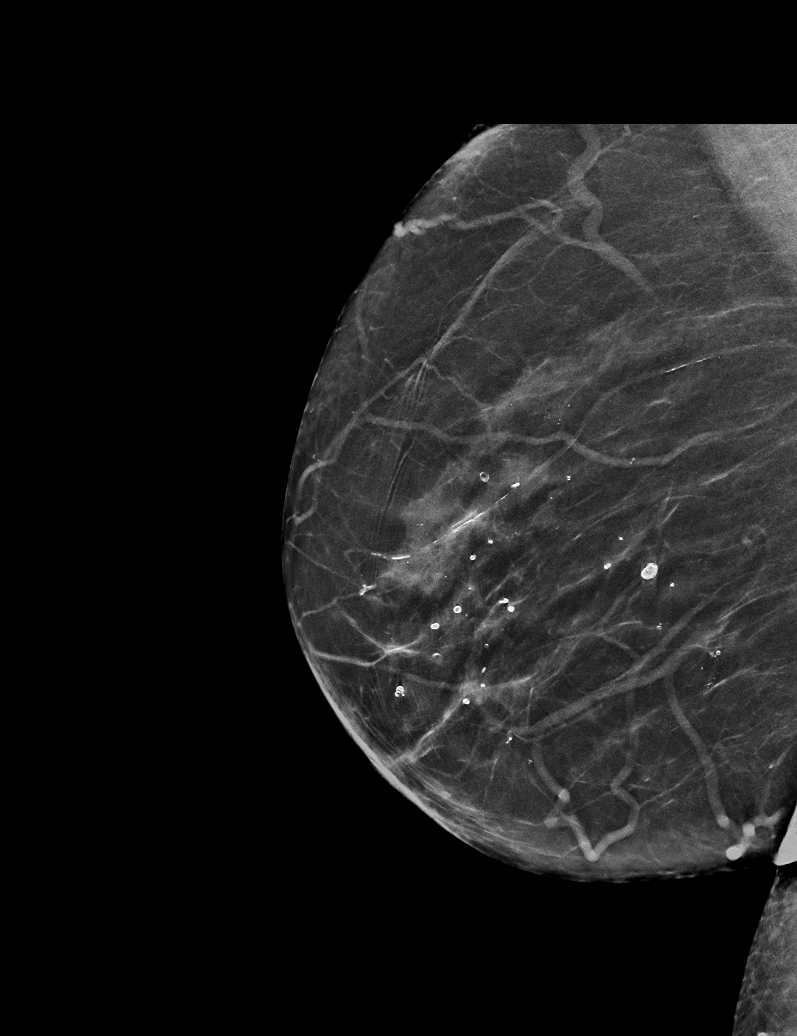

[R MLO synth-2D (2 of 2)]
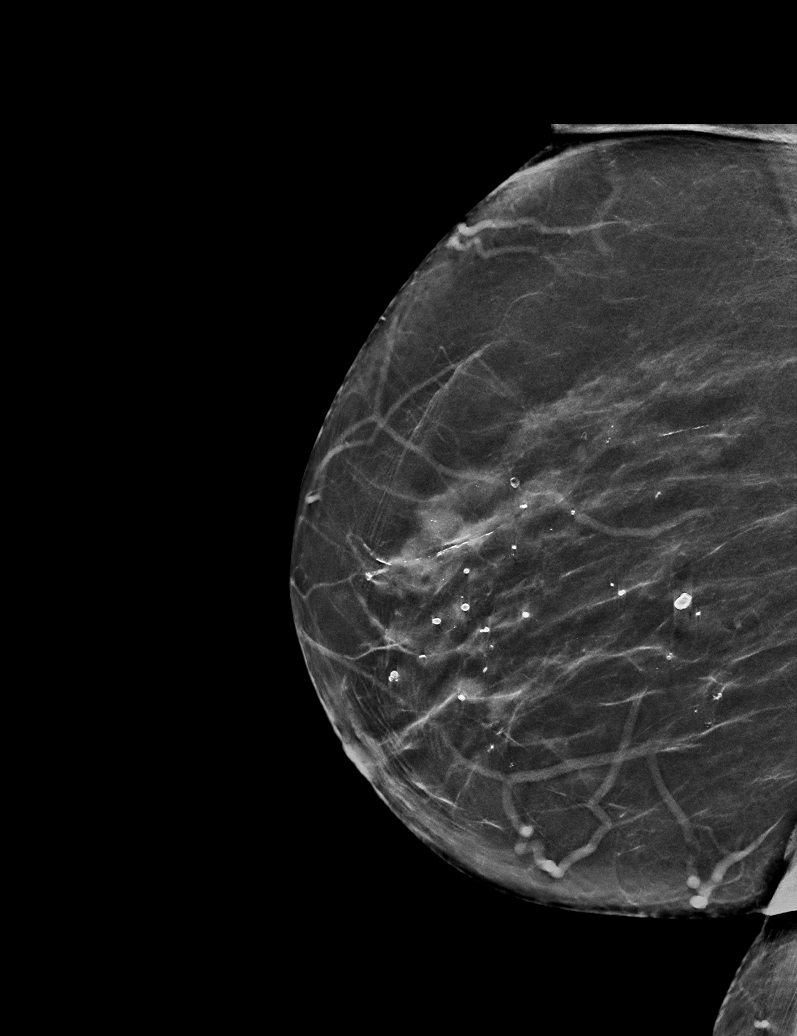

[L MLO synth-2D]
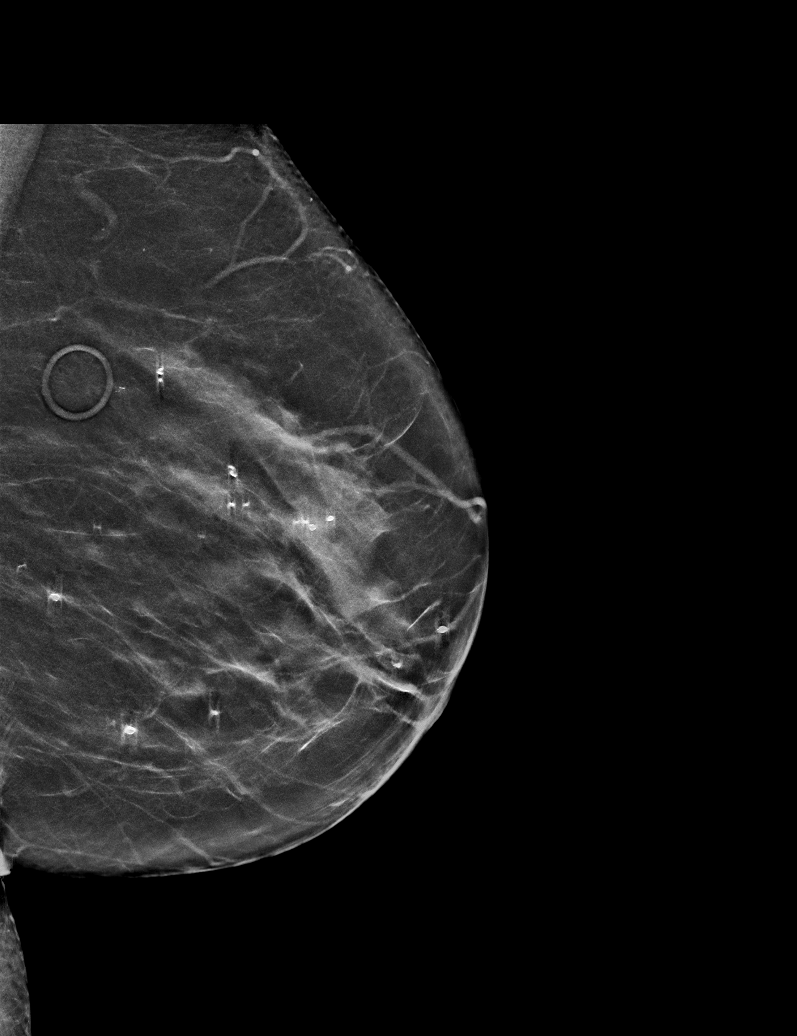

[L CC synth-2D]
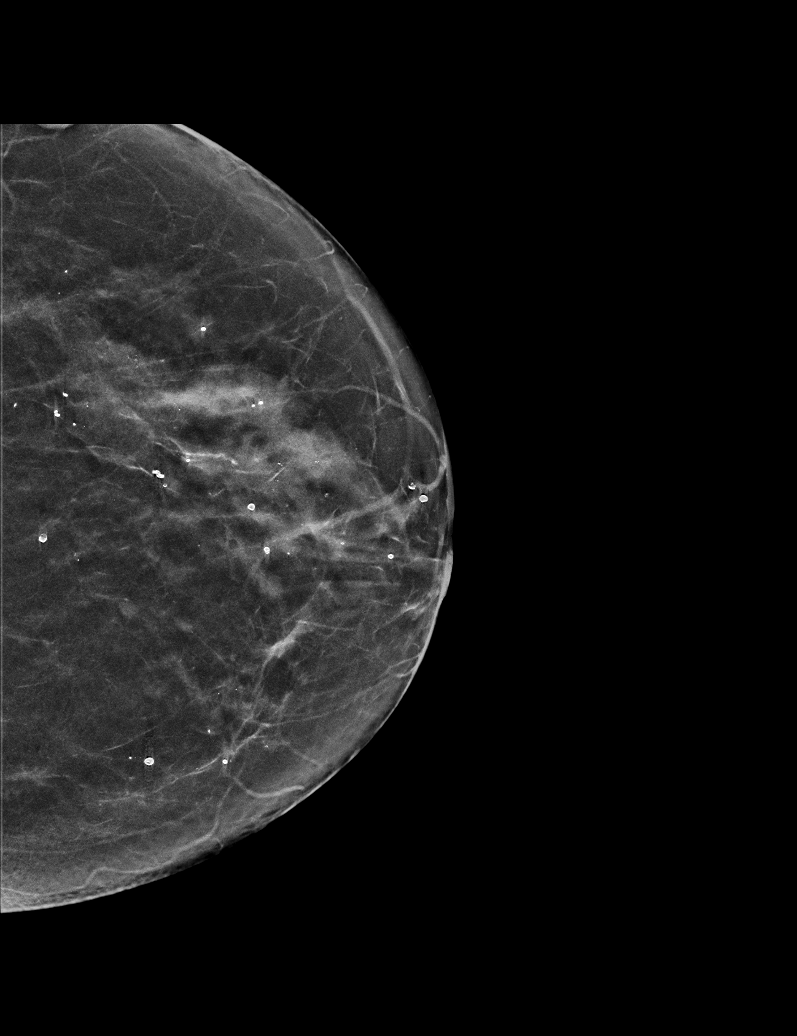

[R CC synth-2D]
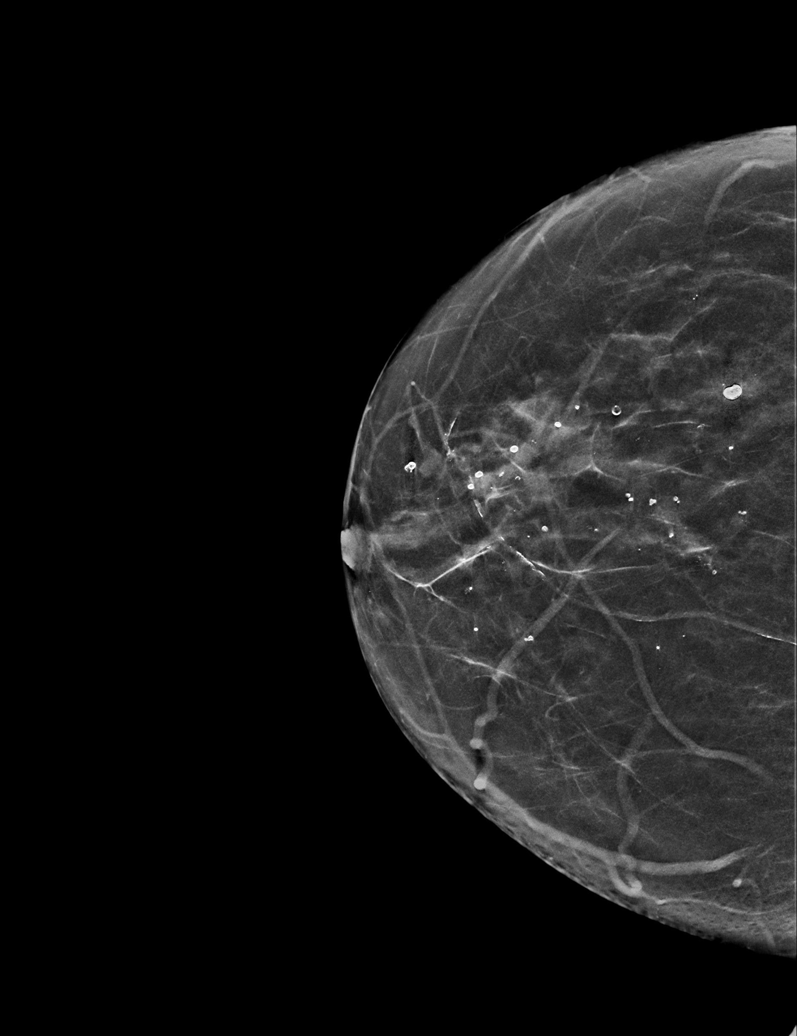

[L MLO tomo · tomo slice 30/59.0]
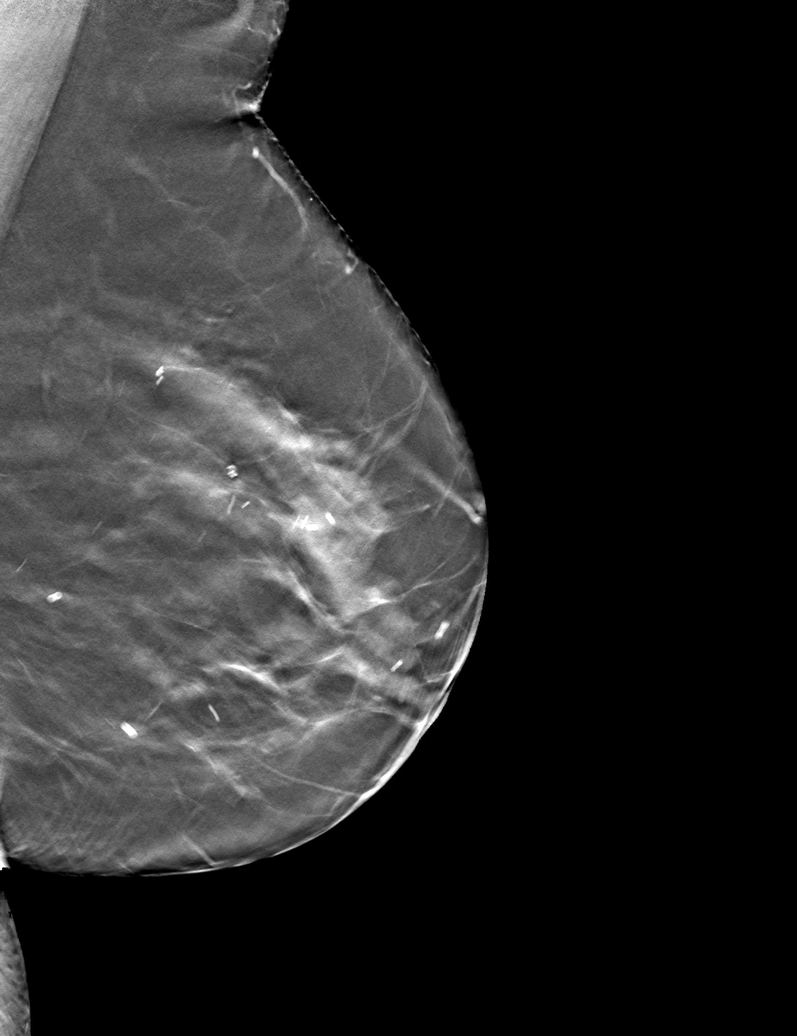

[6 of 30 positions shown; findings below may reference images not displayed]

ACR Breast Density Category c: The breast tissue is heterogeneously
dense, which may obscure small masses.
FINDINGS: The mass previously seen in the lateral aspect of the right breast
has calcified in the interim with a large smooth oval dystrophic
calcifications. This is a benign finding. No suspicious
calcifications, masses or areas of distortion are seen in the
bilateral breasts.

Mammographic images were processed with CAD.
IMPRESSION: 1.  The mass previously seen in the lateral right breast is benign.

2.  No mammographic evidence of malignancy in the bilateral breasts.

RECOMMENDATION:
Screening mammogram in one year.(Code:AP-[id number])

I have discussed the findings and recommendations with the patient.
Results were also provided in writing at the conclusion of the
visit. If applicable, a reminder letter will be sent to the patient
regarding the next appointment.

BI-RADS CATEGORY  2: Benign.

## 2019-08-30 ENCOUNTER — Encounter: Payer: Self-pay | Admitting: Gastroenterology

## 2019-11-05 DIAGNOSIS — L308 Other specified dermatitis: Secondary | ICD-10-CM | POA: Diagnosis not present

## 2019-11-05 DIAGNOSIS — C44311 Basal cell carcinoma of skin of nose: Secondary | ICD-10-CM | POA: Diagnosis not present

## 2019-11-05 DIAGNOSIS — L570 Actinic keratosis: Secondary | ICD-10-CM | POA: Diagnosis not present

## 2019-11-05 DIAGNOSIS — B078 Other viral warts: Secondary | ICD-10-CM | POA: Diagnosis not present

## 2019-11-05 DIAGNOSIS — X32XXXA Exposure to sunlight, initial encounter: Secondary | ICD-10-CM | POA: Diagnosis not present

## 2019-11-05 DIAGNOSIS — L304 Erythema intertrigo: Secondary | ICD-10-CM | POA: Diagnosis not present

## 2019-11-05 DIAGNOSIS — D0439 Carcinoma in situ of skin of other parts of face: Secondary | ICD-10-CM | POA: Diagnosis not present

## 2019-11-12 DIAGNOSIS — I10 Essential (primary) hypertension: Secondary | ICD-10-CM | POA: Diagnosis not present

## 2019-11-12 DIAGNOSIS — Z6837 Body mass index (BMI) 37.0-37.9, adult: Secondary | ICD-10-CM | POA: Diagnosis not present

## 2019-11-12 DIAGNOSIS — E1165 Type 2 diabetes mellitus with hyperglycemia: Secondary | ICD-10-CM | POA: Diagnosis not present

## 2019-12-03 DIAGNOSIS — L570 Actinic keratosis: Secondary | ICD-10-CM | POA: Diagnosis not present

## 2019-12-03 DIAGNOSIS — Z08 Encounter for follow-up examination after completed treatment for malignant neoplasm: Secondary | ICD-10-CM | POA: Diagnosis not present

## 2019-12-03 DIAGNOSIS — X32XXXD Exposure to sunlight, subsequent encounter: Secondary | ICD-10-CM | POA: Diagnosis not present

## 2019-12-03 DIAGNOSIS — C44311 Basal cell carcinoma of skin of nose: Secondary | ICD-10-CM | POA: Diagnosis not present

## 2019-12-03 DIAGNOSIS — Z85828 Personal history of other malignant neoplasm of skin: Secondary | ICD-10-CM | POA: Diagnosis not present

## 2020-01-23 DIAGNOSIS — Z08 Encounter for follow-up examination after completed treatment for malignant neoplasm: Secondary | ICD-10-CM | POA: Diagnosis not present

## 2020-01-23 DIAGNOSIS — Z85828 Personal history of other malignant neoplasm of skin: Secondary | ICD-10-CM | POA: Diagnosis not present

## 2020-02-09 ENCOUNTER — Ambulatory Visit: Payer: PPO | Attending: Internal Medicine

## 2020-02-09 DIAGNOSIS — Z23 Encounter for immunization: Secondary | ICD-10-CM | POA: Insufficient documentation

## 2020-02-09 NOTE — Progress Notes (Signed)
   Covid-19 Vaccination Clinic  Name:  Gabrielle Clay    MRN: [id number] DOB: [birth year]  02/09/2020  [name] was observed post Covid-19 immunization for 15 minutes without incident. She was provided with Vaccine Information Sheet and instruction to access the V-Safe system.   [name] was instructed to call 911 with any severe reactions post vaccine: Marland Kitchen Difficulty breathing  . Swelling of face and throat  . A fast heartbeat  . A bad rash all over body  . Dizziness and weakness   Immunizations Administered    Name Date Dose VIS Date Route   Moderna COVID-19 Vaccine 02/09/2020 10:34 AM 0.5 mL 11/06/2019 Intramuscular   Manufacturer: Moderna   Lot: [id number]   [id number]

## 2020-02-18 DIAGNOSIS — Z08 Encounter for follow-up examination after completed treatment for malignant neoplasm: Secondary | ICD-10-CM | POA: Diagnosis not present

## 2020-02-18 DIAGNOSIS — X32XXXD Exposure to sunlight, subsequent encounter: Secondary | ICD-10-CM | POA: Diagnosis not present

## 2020-02-18 DIAGNOSIS — L570 Actinic keratosis: Secondary | ICD-10-CM | POA: Diagnosis not present

## 2020-02-18 DIAGNOSIS — C44311 Basal cell carcinoma of skin of nose: Secondary | ICD-10-CM | POA: Diagnosis not present

## 2020-02-18 DIAGNOSIS — Z85828 Personal history of other malignant neoplasm of skin: Secondary | ICD-10-CM | POA: Diagnosis not present

## 2020-03-12 ENCOUNTER — Ambulatory Visit: Payer: PPO | Attending: Internal Medicine

## 2020-03-12 DIAGNOSIS — Z23 Encounter for immunization: Secondary | ICD-10-CM

## 2020-03-12 NOTE — Progress Notes (Signed)
   Covid-19 Vaccination Clinic  Name:  Gabrielle Clay    MRN: [id number] DOB: August 14, 1951  03/12/2020  Gabrielle Clay was observed post Covid-19 immunization for 15 minutes without incident. She was provided with Vaccine Information Sheet and instruction to access the V-Safe system.   Gabrielle Clay was instructed to call 911 with any severe reactions post vaccine: Marland Kitchen Difficulty breathing  . Swelling of face and throat  . A fast heartbeat  . A bad rash all over body  . Dizziness and weakness   Immunizations Administered    Name Date Dose VIS Date Route   Moderna COVID-19 Vaccine 03/12/2020 10:29 AM 0.5 mL 11/06/2019 Intramuscular   Manufacturer: Levan Hurst   LotUD:[id number]   [place]:[id number]

## 2020-03-31 DIAGNOSIS — C44311 Basal cell carcinoma of skin of nose: Secondary | ICD-10-CM | POA: Diagnosis not present

## 2020-04-09 DIAGNOSIS — E119 Type 2 diabetes mellitus without complications: Secondary | ICD-10-CM | POA: Diagnosis not present

## 2020-04-09 DIAGNOSIS — E1165 Type 2 diabetes mellitus with hyperglycemia: Secondary | ICD-10-CM | POA: Diagnosis not present

## 2020-04-09 DIAGNOSIS — Z0001 Encounter for general adult medical examination with abnormal findings: Secondary | ICD-10-CM | POA: Diagnosis not present

## 2020-04-09 DIAGNOSIS — E063 Autoimmune thyroiditis: Secondary | ICD-10-CM | POA: Diagnosis not present

## 2020-04-09 DIAGNOSIS — I10 Essential (primary) hypertension: Secondary | ICD-10-CM | POA: Diagnosis not present

## 2020-04-09 DIAGNOSIS — K219 Gastro-esophageal reflux disease without esophagitis: Secondary | ICD-10-CM | POA: Diagnosis not present

## 2020-04-09 DIAGNOSIS — E7849 Other hyperlipidemia: Secondary | ICD-10-CM | POA: Diagnosis not present

## 2020-04-09 DIAGNOSIS — Z1389 Encounter for screening for other disorder: Secondary | ICD-10-CM | POA: Diagnosis not present

## 2020-04-09 DIAGNOSIS — F17200 Nicotine dependence, unspecified, uncomplicated: Secondary | ICD-10-CM | POA: Diagnosis not present

## 2020-04-09 DIAGNOSIS — Z6834 Body mass index (BMI) 34.0-34.9, adult: Secondary | ICD-10-CM | POA: Diagnosis not present

## 2020-04-25 ENCOUNTER — Encounter (HOSPITAL_COMMUNITY): Payer: Self-pay | Admitting: *Deleted

## 2020-04-25 ENCOUNTER — Other Ambulatory Visit: Payer: Self-pay

## 2020-04-28 ENCOUNTER — Inpatient Hospital Stay (HOSPITAL_COMMUNITY): Payer: PPO

## 2020-04-28 ENCOUNTER — Other Ambulatory Visit: Payer: Self-pay

## 2020-04-28 ENCOUNTER — Inpatient Hospital Stay (HOSPITAL_COMMUNITY): Payer: PPO | Attending: Hematology | Admitting: Hematology

## 2020-04-28 ENCOUNTER — Encounter (HOSPITAL_COMMUNITY): Payer: Self-pay | Admitting: Hematology

## 2020-04-28 DIAGNOSIS — I129 Hypertensive chronic kidney disease with stage 1 through stage 4 chronic kidney disease, or unspecified chronic kidney disease: Secondary | ICD-10-CM | POA: Diagnosis not present

## 2020-04-28 DIAGNOSIS — Z809 Family history of malignant neoplasm, unspecified: Secondary | ICD-10-CM | POA: Diagnosis not present

## 2020-04-28 DIAGNOSIS — K760 Fatty (change of) liver, not elsewhere classified: Secondary | ICD-10-CM | POA: Diagnosis not present

## 2020-04-28 DIAGNOSIS — F17210 Nicotine dependence, cigarettes, uncomplicated: Secondary | ICD-10-CM | POA: Diagnosis not present

## 2020-04-28 DIAGNOSIS — D72829 Elevated white blood cell count, unspecified: Secondary | ICD-10-CM | POA: Insufficient documentation

## 2020-04-28 DIAGNOSIS — Z85828 Personal history of other malignant neoplasm of skin: Secondary | ICD-10-CM | POA: Insufficient documentation

## 2020-04-28 DIAGNOSIS — Z803 Family history of malignant neoplasm of breast: Secondary | ICD-10-CM | POA: Diagnosis not present

## 2020-04-28 DIAGNOSIS — Z801 Family history of malignant neoplasm of trachea, bronchus and lung: Secondary | ICD-10-CM | POA: Insufficient documentation

## 2020-04-28 DIAGNOSIS — Z79899 Other long term (current) drug therapy: Secondary | ICD-10-CM | POA: Diagnosis not present

## 2020-04-28 DIAGNOSIS — D72828 Other elevated white blood cell count: Secondary | ICD-10-CM | POA: Insufficient documentation

## 2020-04-28 DIAGNOSIS — N269 Renal sclerosis, unspecified: Secondary | ICD-10-CM | POA: Insufficient documentation

## 2020-04-28 DIAGNOSIS — R21 Rash and other nonspecific skin eruption: Secondary | ICD-10-CM | POA: Insufficient documentation

## 2020-04-28 DIAGNOSIS — N281 Cyst of kidney, acquired: Secondary | ICD-10-CM | POA: Insufficient documentation

## 2020-04-28 DIAGNOSIS — E039 Hypothyroidism, unspecified: Secondary | ICD-10-CM

## 2020-04-28 DIAGNOSIS — D72825 Bandemia: Secondary | ICD-10-CM

## 2020-04-28 DIAGNOSIS — N189 Chronic kidney disease, unspecified: Secondary | ICD-10-CM | POA: Diagnosis not present

## 2020-04-28 LAB — CBC WITH DIFFERENTIAL/PLATELET
Abs Immature Granulocytes: 0.05 10*3/uL (ref 0.00–0.07)
Basophils Absolute: 0.1 10*3/uL (ref 0.0–0.1)
Basophils Relative: 1 %
Eosinophils Absolute: 0.4 10*3/uL (ref 0.0–0.5)
Eosinophils Relative: 4 %
HCT: 41.1 % (ref 36.0–46.0)
Hemoglobin: 13 g/dL (ref 12.0–15.0)
Immature Granulocytes: 0 %
Lymphocytes Relative: 25 %
Lymphs Abs: 3 10*3/uL (ref 0.7–4.0)
MCH: 30.7 pg (ref 26.0–34.0)
MCHC: 31.6 g/dL (ref 30.0–36.0)
MCV: 96.9 fL (ref 80.0–100.0)
Monocytes Absolute: 0.7 10*3/uL (ref 0.1–1.0)
Monocytes Relative: 6 %
Neutro Abs: 7.5 10*3/uL (ref 1.7–7.7)
Neutrophils Relative %: 64 %
Platelets: 408 10*3/uL — ABNORMAL HIGH (ref 150–400)
RBC: 4.24 MIL/uL (ref 3.87–5.11)
RDW: 14.6 % (ref 11.5–15.5)
WBC: 11.7 10*3/uL — ABNORMAL HIGH (ref 4.0–10.5)
nRBC: 0 % (ref 0.0–0.2)

## 2020-04-28 LAB — SEDIMENTATION RATE: Sed Rate: 32 mm/hr — ABNORMAL HIGH (ref 0–22)

## 2020-04-28 LAB — LACTATE DEHYDROGENASE: LDH: 154 U/L (ref 98–192)

## 2020-04-28 NOTE — Patient Instructions (Signed)
[place] at [facility] Discharge Instructions  You were seen today by Dr. Delton Coombes. He went over your history, family history and how you've been feeling lately. You will have blood drawn today prior to leaving the hospital. He will see you back in 2 weeks for follow up.   Thank you for choosing [place] at [facility] to provide your oncology and hematology care.  To afford each patient quality time with our provider, please arrive at least 15 minutes before your scheduled appointment time.   If you have a lab appointment with the [facility] please come in thru the  Main Entrance and check in at the main information desk  You need to re-schedule your appointment should you arrive 10 or more minutes late.  We strive to give you quality time with our providers, and arriving late affects you and other patients whose appointments are after yours.  Also, if you no show three or more times for appointments you may be dismissed from the clinic at the providers discretion.     Again, thank you for choosing [facility].  Our hope is that these requests will decrease the amount of time that you wait before being seen by our physicians.       _____________________________________________________________  Should you have questions after your visit to [facility], please contact our office at [phone] between the hours of 8:00 a.m. and 4:30 p.m.  Voicemails left after 4:00 p.m. will not be returned until the following business day.  For prescription refill requests, have your pharmacy contact our office and allow 72 hours.    Cancer Center Support Programs:   > Cancer Support Group  2nd Tuesday of the month 1pm-2pm, Journey Room

## 2020-04-28 NOTE — Progress Notes (Signed)
CONSULT NOTE  Patient Care Team: Redmond School, MD as PCP - General (Internal Medicine)  CHIEF COMPLAINTS/PURPOSE OF CONSULTATION:  Leukocytosis.  HISTORY OF PRESENTING ILLNESS:  Gabrielle Clay 69 y.o. female is seen in consultation today at the request of Dr. Gerarda Fraction for further work-up and management of leukocytosis.  Labs done at his office on 04/09/2020 showed elevated white count of 11.2 with normal hemoglobin and platelets.  Differential showed 63% neutrophils, 26% lymphocytes and elevated absolute neutrophil count of 7200.  Denies any fevers, night sweats or significant weight loss.  She has been trying to lose weight.  Denies any recurrent infections.  Denies any chronic steroid use systemically.  However she uses betamethasone 0.05% cream on the back on and off for the past 2 to 3 years for a skin rash.  She was reportedly also evaluated by Dr. Lavone Neri from dermatology.  Denies any personal history of connective tissue disorders.  Patient is a current active smoker, 1 pack/day for the past 30 years.  She did some office jobs prior to retirement.  Family history significant for 3 paternal aunts with history of cancer.  1 paternal aunt had breast cancer, another paternal aunt had lung cancer and another died of cancer, patient does not know the type.  Denies any recent hospitalizations.  Reports appetite 100%.  Energy levels are 75%.  No active pains reported today.  MEDICAL HISTORY:  Past Medical History:  Diagnosis Date  . GERD (gastroesophageal reflux disease)   . Hypertension   . Low back pain   . Skin cancer   . Thyroid disease    hypothyroidism    SURGICAL HISTORY: Past Surgical History:  Procedure Laterality Date  . TUBAL LIGATION      SOCIAL HISTORY: Social History   Socioeconomic History  . Marital status: Married    Spouse name: Jeneen Rinks  . Number of children: 2  . Years of education: Not on file  . Highest education level: Not on file  Occupational History  .  Occupation: retired  Tobacco Use  . Smoking status: Current Every Day Smoker    Packs/day: 1.00    Years: 30.00    Pack years: 30.00    Types: Cigarettes  . Smokeless tobacco: Never Used  Substance and Sexual Activity  . Alcohol use: No    Alcohol/week: 0.0 standard drinks  . Drug use: No  . Sexual activity: Not on file  Other Topics Concern  . Not on file  Social History Narrative  . Not on file   Social Determinants of Health   Financial Resource Strain:   . Difficulty of Paying Living Expenses:   Food Insecurity:   . Worried About Charity fundraiser in the Last Year:   . Arboriculturist in the Last Year:   Transportation Needs:   . Film/video editor (Medical):   Marland Kitchen Lack of Transportation (Non-Medical):   Physical Activity:   . Days of Exercise per Week:   . Minutes of Exercise per Session:   Stress:   . Feeling of Stress :   Social Connections:   . Frequency of Communication with Friends and Family:   . Frequency of Social Gatherings with Friends and Family:   . Attends Religious Services:   . Active Member of Clubs or Organizations:   . Attends Archivist Meetings:   Marland Kitchen Marital Status:   Intimate Partner Violence:   . Fear of Current or Ex-Partner:   . Emotionally Abused:   .  Physically Abused:   . Sexually Abused:     FAMILY HISTORY: Family History  Problem Relation Age of Onset  . Stroke Mother   . Heart block Mother   . Hypertension Father   . Diabetes Brother   . Hypertension Brother   . Breast cancer Son     ALLERGIES:  is allergic to amlodipine and sulfa antibiotics.  MEDICATIONS:  Current Outpatient Medications  Medication Sig Dispense Refill  . aspirin 81 MG tablet Take 81 mg by mouth daily.    . chlorthalidone (HYGROTON) 25 MG tablet Take 25 mg by mouth daily.    Marland Kitchen gemfibrozil (LOPID) 600 MG tablet Take 600 mg by mouth 2 (two) times daily before a meal.    . glipiZIDE (GLUCOTROL XL) 10 MG 24 hr tablet Take 10 mg by mouth in  the morning and at bedtime.    Marland Kitchen levothyroxine (SYNTHROID, LEVOTHROID) 50 MCG tablet Take 50 mcg by mouth daily before breakfast.   2  . LOSARTAN POTASSIUM PO Take 100 mg by mouth daily.    Marland Kitchen METOPROLOL TARTRATE PO Take 25 mg by mouth in the morning and at bedtime.    . Omega-3 Fatty Acids (FISH OIL) 1000 MG CAPS Take 1,000 mg by mouth 2 (two) times daily.    . pantoprazole (PROTONIX) 40 MG tablet Take 40 mg by mouth daily.   2  . rosuvastatin (CRESTOR) 20 MG tablet Take 20 mg by mouth daily.    . TERBINAFINE HCL PO Take by mouth as needed.     No current facility-administered medications for this visit.    REVIEW OF SYSTEMS:   Constitutional: Denies fevers, chills or abnormal night sweats Eyes: Denies blurriness of vision, double vision or watery eyes Ears, nose, mouth, throat, and face: Denies mucositis or sore throat Respiratory: Denies cough, dyspnea or wheezes Cardiovascular: Denies palpitation, chest discomfort or lower extremity swelling Gastrointestinal:  Denies nausea, heartburn or change in bowel habits Skin: Denies abnormal skin rashes Lymphatics: Denies new lymphadenopathy or easy bruising Neurological:Denies numbness, tingling or new weaknesses Behavioral/Psych: Mood is stable, no new changes  All other systems were reviewed with the patient and are negative.  PHYSICAL EXAMINATION: ECOG PERFORMANCE STATUS: 1 - Symptomatic but completely ambulatory  Vitals:   04/28/20 0814  BP: (!) 159/61  Pulse: 78  Resp: 18  Temp: (!) 96.9 F (36.1 C)  SpO2: 100%   Filed Weights   04/28/20 0814  Weight: 191 lb 9.6 oz (86.9 kg)    GENERAL:alert, no distress and comfortable SKIN: skin color, texture, turgor are normal, no rashes or significant lesions EYES: normal, conjunctiva are pink and non-injected, sclera clear OROPHARYNX:no exudate, no erythema and lips, buccal mucosa, and tongue normal  NECK: supple, thyroid normal size, non-tender, without nodularity LYMPH:  no  palpable lymphadenopathy in the cervical, axillary or inguinal LUNGS: clear to auscultation and percussion with normal breathing effort HEART: regular rate & rhythm and no murmurs and no lower extremity edema ABDOMEN:abdomen soft, non-tender and normal bowel sounds Musculoskeletal:no cyanosis of digits and no clubbing  PSYCH: alert & oriented x 3 with fluent speech NEURO: no focal motor/sensory deficits  LABORATORY DATA:  I have reviewed the data as listed Recent Results (from the past 2160 hour(s))  CBC with Differential/Platelet     Status: Abnormal   Collection Time: 04/28/20  8:43 AM  Result Value Ref Range   WBC 11.7 (H) 4.0 - 10.5 K/uL   RBC 4.24 3.87 - 5.11 MIL/uL   Hemoglobin  13.0 12.0 - 15.0 g/dL   HCT 41.1 36.0 - 46.0 %   MCV 96.9 80.0 - 100.0 fL   MCH 30.7 26.0 - 34.0 pg   MCHC 31.6 30.0 - 36.0 g/dL   RDW 14.6 11.5 - 15.5 %   Platelets 408 (H) 150 - 400 K/uL   nRBC 0.0 0.0 - 0.2 %   Neutrophils Relative % 64 %   Neutro Abs 7.5 1.7 - 7.7 K/uL   Lymphocytes Relative 25 %   Lymphs Abs 3.0 0.7 - 4.0 K/uL   Monocytes Relative 6 %   Monocytes Absolute 0.7 0.1 - 1.0 K/uL   Eosinophils Relative 4 %   Eosinophils Absolute 0.4 0.0 - 0.5 K/uL   Basophils Relative 1 %   Basophils Absolute 0.1 0.0 - 0.1 K/uL   Immature Granulocytes 0 %   Abs Immature Granulocytes 0.05 0.00 - 0.07 K/uL    Comment: Performed at [facility], [address] [place], [place] [address]  Lactate dehydrogenase     Status: None   Collection Time: 04/28/20  8:43 AM  Result Value Ref Range   LDH 154 98 - 192 U/L    Comment: Performed at [facility], [address] [place], [place] [address]  Sedimentation rate     Status: Abnormal   Collection Time: 04/28/20  8:43 AM  Result Value Ref Range   Sed Rate 32 (H) 0 - 22 mm/hr    Comment: Performed at [facility], [address], [place], [place] [address]    RADIOGRAPHIC STUDIES: I have personally reviewed the radiological images as listed and  agreed with the findings in the report.  ASSESSMENT & PLAN:  Leukocytosis 1.  Neutrophilic leukocytosis: -Patient seen at the request of Dr. Gerarda Fraction for persistent leukocytosis on and off for the last 4 years. -CBC on 04/09/2020 shows white count 11.2 with normal hemoglobin and platelets.  Differential showed 63% neutrophils, 26% lymphocytes with elevated absolute neutrophil count of 7200. -Ultrasound of the abdomen on 06/02/2015 showed hepatic steatosis. -Denies any fevers, night sweats or significant weight loss.  She has been trying to lose weight. -Denies any recurrent infections.  Denies any systemic steroid use. -She is using betamethasone 0.05% cream on the back on and off for the last 2 to 3 years for a skin rash. -Physical examination does not show any palpable adenopathy or splenomegaly. -We will repeat her CBC and review her smear.  We will check for myeloproliferative disorders with JAK2 V617F mutation and BCR/ABL.  We will also check ANA, ESR and rheumatoid factor. -We will see her back in 2 to 3 weeks for follow-up.  2.  CKD: -Her creatinine on 04/09/2020 was 1.15 with EGFR of 49 mL/min. -Renal ultrasound on 09/09/2017 shows simple right renal cysts, mild left renal atrophy.  3.  Tobacco abuse: -She is a current active smoker, smokes 1 pack/day for 30+ years. -I talked to her in detail about our lung cancer screening program with low-dose CT of the chest.  She is agreeable.  We will make a referral to our lung cancer screening clinic.     All questions were answered. The patient knows to call the clinic with any problems, questions or concerns.     Derek Jack, MD 04/28/20 7:01 PM

## 2020-04-28 NOTE — Assessment & Plan Note (Addendum)
1.  Neutrophilic leukocytosis: -Patient seen at the request of Dr. Gerarda Fraction for persistent leukocytosis on and off for the last 4 years. -CBC on 04/09/2020 shows white count 11.2 with normal hemoglobin and platelets.  Differential showed 63% neutrophils, 26% lymphocytes with elevated absolute neutrophil count of 7200. -Ultrasound of the abdomen on 06/02/2015 showed hepatic steatosis. -Denies any fevers, night sweats or significant weight loss.  She has been trying to lose weight. -Denies any recurrent infections.  Denies any systemic steroid use. -She is using betamethasone 0.05% cream on the back on and off for the last 2 to 3 years for a skin rash. -Physical examination does not show any palpable adenopathy or splenomegaly. -We will repeat her CBC and review her smear.  We will check for myeloproliferative disorders with JAK2 V617F mutation and BCR/ABL.  We will also check ANA, ESR and rheumatoid factor. -We will see her back in 2 to 3 weeks for follow-up.  2.  CKD: -Her creatinine on 04/09/2020 was 1.15 with EGFR of 49 mL/min. -Renal ultrasound on 09/09/2017 shows simple right renal cysts, mild left renal atrophy.  3.  Tobacco abuse: -She is a current active smoker, smokes 1 pack/day for 30+ years. -I talked to her in detail about our lung cancer screening program with low-dose CT of the chest.  She is agreeable.  We will make a referral to our lung cancer screening clinic.

## 2020-04-29 LAB — RHEUMATOID FACTOR: Rheumatoid fact SerPl-aCnc: 36.7 IU/mL — ABNORMAL HIGH (ref 0.0–13.9)

## 2020-04-29 LAB — PATHOLOGIST SMEAR REVIEW

## 2020-04-29 LAB — ANTINUCLEAR ANTIBODIES, IFA: ANA Ab, IFA: NEGATIVE

## 2020-05-02 LAB — BCR-ABL1 FISH
Cells Analyzed: 200
Cells Counted: 200

## 2020-05-05 DIAGNOSIS — E1129 Type 2 diabetes mellitus with other diabetic kidney complication: Secondary | ICD-10-CM | POA: Diagnosis not present

## 2020-05-05 DIAGNOSIS — Z720 Tobacco use: Secondary | ICD-10-CM | POA: Diagnosis not present

## 2020-05-05 DIAGNOSIS — I10 Essential (primary) hypertension: Secondary | ICD-10-CM | POA: Diagnosis not present

## 2020-05-05 DIAGNOSIS — I2510 Atherosclerotic heart disease of native coronary artery without angina pectoris: Secondary | ICD-10-CM | POA: Diagnosis not present

## 2020-05-06 ENCOUNTER — Encounter (HOSPITAL_COMMUNITY): Payer: Self-pay | Admitting: *Deleted

## 2020-05-06 ENCOUNTER — Other Ambulatory Visit (HOSPITAL_COMMUNITY): Payer: Self-pay | Admitting: *Deleted

## 2020-05-06 DIAGNOSIS — Z122 Encounter for screening for malignant neoplasm of respiratory organs: Secondary | ICD-10-CM

## 2020-05-06 DIAGNOSIS — Z87891 Personal history of nicotine dependence: Secondary | ICD-10-CM

## 2020-05-06 LAB — CALR + JAK2 E12-15 + MPL (REFLEXED)

## 2020-05-06 LAB — JAK2 V617F, W REFLEX TO CALR/E12/MPL

## 2020-05-06 NOTE — Progress Notes (Signed)
Received referral for initial lung cancer screening scan.  Contacted patient and obtained smoking history (started age 69, half pack per day until late 34s maybe around 25, increased to 1ppd and still smokes that much. She did quit for 2 years in there during her pregnancies, 40.5 pack year) as well as answering questions related to the screening process.  Patient denies signs/symptoms of lung cancer such as weight loss or hemoptysis.  Patient denies comorbidity that would prevent curative treatment if lung cancer were to be found.  Patient wants to call her insurance company and verify herself that they will cover it as screening exam and she will call me back to schedule it.

## 2020-05-13 ENCOUNTER — Other Ambulatory Visit: Payer: Self-pay

## 2020-05-13 ENCOUNTER — Inpatient Hospital Stay (HOSPITAL_COMMUNITY): Payer: PPO | Attending: Hematology | Admitting: Hematology

## 2020-05-13 VITALS — BP 141/89 | HR 86 | Temp 97.2°F | Resp 18 | Wt 193.2 lb

## 2020-05-13 DIAGNOSIS — D72828 Other elevated white blood cell count: Secondary | ICD-10-CM | POA: Diagnosis not present

## 2020-05-13 DIAGNOSIS — I129 Hypertensive chronic kidney disease with stage 1 through stage 4 chronic kidney disease, or unspecified chronic kidney disease: Secondary | ICD-10-CM | POA: Diagnosis not present

## 2020-05-13 DIAGNOSIS — N189 Chronic kidney disease, unspecified: Secondary | ICD-10-CM | POA: Diagnosis not present

## 2020-05-13 DIAGNOSIS — D72825 Bandemia: Secondary | ICD-10-CM

## 2020-05-13 DIAGNOSIS — F17210 Nicotine dependence, cigarettes, uncomplicated: Secondary | ICD-10-CM | POA: Insufficient documentation

## 2020-05-13 NOTE — Patient Instructions (Signed)
[place] at [facility] Discharge Instructions  You were seen today by Dr. Delton Coombes. He went over your recent results and possible diagnoses. Dr. Delton Coombes will see you back in 6 months for labs and follow up.   Thank you for choosing [place] at [facility] to provide your oncology and hematology care.  To afford each patient quality time with our provider, please arrive at least 15 minutes before your scheduled appointment time.   If you have a lab appointment with the [place] please come in thru the Main Entrance and check in at the main information desk  You need to re-schedule your appointment should you arrive 10 or more minutes late.  We strive to give you quality time with our providers, and arriving late affects you and other patients whose appointments are after yours.  Also, if you no show three or more times for appointments you may be dismissed from the clinic at the providers discretion.     Again, thank you for choosing [facility].  Our hope is that these requests will decrease the amount of time that you wait before being seen by our physicians.       _____________________________________________________________  Should you have questions after your visit to [facility], please contact our office at [phone] between the hours of 8:00 a.m. and 4:30 p.m.  Voicemails left after 4:00 p.m. will not be returned until the following business day.  For prescription refill requests, have your pharmacy contact our office and allow 72 hours.    Cancer Center Support Programs:   > Cancer Support Group  2nd Tuesday of the month 1pm-2pm, Journey Room

## 2020-05-13 NOTE — Progress Notes (Signed)
[place], [place] [address]   CLINIC:  Medical Oncology/Hematology  PCP:  [facility] [address], [place] / [place] [address]  [phone]  REASON FOR VISIT:  Follow-up for leukocytosis  CURRENT THERAPY: Observation  INTERVAL HISTORY:  Gabrielle Clay, a 69 y.o. female, returns for routine follow-up for her leukocytosis. Gabrielle Clay was last seen on 04/28/2020.  She reports feeling well. She denies having any F/C or having to go to the ED. She is still using the steroid cream on her back "on-and-off." She reports having arthritis in her fingers and toes but it is tolerable; she reports her fingers will get stiff after inactivity. Her mother had RA.  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   REVIEW OF SYSTEMS:  Review of Systems  Constitutional: Negative for appetite change and fatigue.  Musculoskeletal: Positive for arthralgias (arthritis in fingers & toes).  All other systems reviewed and are negative.   PAST MEDICAL/SURGICAL HISTORY:  Past Medical History:  Diagnosis Date  . GERD (gastroesophageal reflux disease)   . Hypertension   . Low back pain   . Skin cancer   . Thyroid disease    hypothyroidism   Past Surgical History:  Procedure Laterality Date  . TUBAL LIGATION      SOCIAL HISTORY:  Social History   Socioeconomic History  . Marital status: Married    Spouse name: Jeneen Rinks  . Number of children: 2  . Years of education: Not on file  . Highest education level: Not on file  Occupational History  . Occupation: retired  Tobacco Use  . Smoking status: Current Every Day Smoker    Packs/day: 1.00    Years: 30.00    Pack years: 30.00    Types: Cigarettes  . Smokeless tobacco: Never Used  Substance and Sexual Activity  . Alcohol use: No    Alcohol/week: 0.0 standard drinks  . Drug use: No  . Sexual activity: Not on file  Other Topics  Concern  . Not on file  Social History Narrative  . Not on file   Social Determinants of Health   Financial Resource Strain:   . Difficulty of Paying Living Expenses:   Food Insecurity:   . Worried About Charity fundraiser in the Last Year:   . Arboriculturist in the Last Year:   Transportation Needs:   . Film/video editor (Medical):   Marland Kitchen Lack of Transportation (Non-Medical):   Physical Activity:   . Days of Exercise per Week:   . Minutes of Exercise per Session:   Stress:   . Feeling of Stress :   Social Connections:   . Frequency of Communication with Friends and Family:   . Frequency of Social Gatherings with Friends and Family:   . Attends Religious Services:   . Active Member of Clubs or Organizations:   . Attends Archivist Meetings:   Marland Kitchen Marital Status:   Intimate Partner Violence:   . Fear of Current or Ex-Partner:   . Emotionally Abused:   Marland Kitchen Physically Abused:   . Sexually Abused:     FAMILY HISTORY:  Family History  Problem Relation Age of Onset  . Stroke Mother   . Heart block Mother   . Hypertension Father   . Diabetes Brother   . Hypertension Brother   . Breast cancer Son  CURRENT MEDICATIONS:  Current Outpatient Medications  Medication Sig Dispense Refill  . aspirin 81 MG tablet Take 81 mg by mouth daily.    . chlorthalidone (HYGROTON) 25 MG tablet Take 25 mg by mouth daily.    Marland Kitchen gemfibrozil (LOPID) 600 MG tablet Take 600 mg by mouth 2 (two) times daily before a meal.    . glipiZIDE (GLUCOTROL XL) 10 MG 24 hr tablet Take 10 mg by mouth in the morning and at bedtime.    Marland Kitchen levothyroxine (SYNTHROID, LEVOTHROID) 50 MCG tablet Take 50 mcg by mouth daily before breakfast.   2  . LOSARTAN POTASSIUM PO Take 100 mg by mouth daily.    Marland Kitchen METOPROLOL TARTRATE PO Take 25 mg by mouth in the morning and at bedtime.    . Omega-3 Fatty Acids (FISH OIL) 1000 MG CAPS Take 1,000 mg by mouth 2 (two) times daily.    . pantoprazole (PROTONIX) 40 MG  tablet Take 40 mg by mouth daily.   2  . rosuvastatin (CRESTOR) 20 MG tablet Take 20 mg by mouth daily.    . TERBINAFINE HCL PO Take by mouth as needed.     No current facility-administered medications for this visit.    ALLERGIES:  Allergies  Allergen Reactions  . Amlodipine Swelling    Swelling of feet  . Sulfa Antibiotics Nausea Only    PHYSICAL EXAM:  Performance status (ECOG): 1 - Symptomatic but completely ambulatory  There were no vitals filed for this visit. Wt Readings from Last 3 Encounters:  04/28/20 191 lb 9.6 oz (86.9 kg)  07/22/15 182 lb (82.6 kg)  06/02/15 190 lb (86.2 kg)   Physical Exam Vitals reviewed.  Constitutional:      Appearance: Normal appearance. She is obese.  Cardiovascular:     Rate and Rhythm: Normal rate and regular rhythm.     Pulses: Normal pulses.     Heart sounds: Normal heart sounds.  Pulmonary:     Effort: Pulmonary effort is normal.     Breath sounds: Normal breath sounds.  Musculoskeletal:     Right lower leg: No edema.     Left lower leg: No edema.  Neurological:     General: No focal deficit present.     Mental Status: She is alert and oriented to person, place, and time.  Psychiatric:        Mood and Affect: Mood normal.        Behavior: Behavior normal.     LABORATORY DATA:  I have reviewed the labs as listed.  CBC Latest Ref Rng & Units 04/28/2020 06/02/2015 09/02/2009  WBC 4.0 - 10.5 K/uL 11.7(H) 16.0(H) 5.8  Hemoglobin 12.0 - 15.0 g/dL 13.0 12.3 8.6(L)  Hematocrit 36.0 - 46.0 % 41.1 35.5(L) 24.5(L)  Platelets 150 - 400 K/uL 408(H) 359 297   CMP Latest Ref Rng & Units 06/02/2015 09/02/2009 09/01/2009  Glucose 65 - 99 mg/dL 456(H) 97 123(H)  BUN 6 - 20 mg/dL 23(H) 11 17  Creatinine 0.44 - 1.00 mg/dL 1.60(H) 0.93 1.02  Sodium 135 - 145 mmol/L 130(L) 138 135  Potassium 3.5 - 5.1 mmol/L 3.2(L) 4.2 DELTA CHECK NOTED 3.4(L)  Chloride 101 - 111 mmol/L 92(L) 108 105  CO2 22 - 32 mmol/L 22 25 24   Calcium 8.9 - 10.3 mg/dL 9.2  8.6 8.5  Total Protein 6.5 - 8.1 g/dL 8.1 - -  Total Bilirubin 0.3 - 1.2 mg/dL 0.6 - -  Alkaline Phos 38 - 126 U/L 106 - -  AST 15 - 41 U/L 21 - -  ALT 14 - 54 U/L 21 - -      Component Value Date/Time   RBC 4.24 04/28/2020 0843   MCV 96.9 04/28/2020 0843   MCH 30.7 04/28/2020 0843   MCHC 31.6 04/28/2020 0843   RDW 14.6 04/28/2020 0843   LYMPHSABS 3.0 04/28/2020 0843   MONOABS 0.7 04/28/2020 0843   EOSABS 0.4 04/28/2020 0843   BASOSABS 0.1 04/28/2020 0843    DIAGNOSTIC IMAGING:  I have independently reviewed the scans and discussed with the patient.   ASSESSMENT:  1.  Neutrophilic leukocytosis: -Patient seen at the request of Dr. Gerarda Fraction for persistent leukocytosis on and off for the last 4 years. -CBC on 04/09/2020 showed white count 11.2 with normal hemoglobin and platelets. -Ultrasound of the abdomen on 06/02/2015 showed hepatic steatosis. -CBC on 04/28/2020 shows white count 11.7 with normal differential. -JAK2 V617F with reflex testing and BCR/ABL was negative. -ANA was negative.  Rheumatoid factor was positive at 36.7.  ESR was 32. -Patient has osteoarthritis of the small joints of the hands.  No known diagnosis of rheumatoid arthritis.  PLAN:  1.  JAK2 V617F/BCR/ABL negative leukocytosis: -We reviewed results from 04/28/2020.  No evidence of myeloproliferative disorders. -This requires periodic monitoring.  Likely etiology includes smoking-related leukocytosis.  If there is any significant changes will consider bone marrow biopsy. -She will come back in 6 months for follow-up with repeat labs.  2.  CKD: -Creatinine on 04/09/2020 was 1.15 with GFR of 49 mL/min. -Renal ultrasound on 09/09/2017 showed simple right renal cyst, mild left renal atrophy.  3.  Tobacco abuse: -She is a current active smoker, smokes 1 pack/day for 30+ years. -She was told to follow-up with our lung cancer screening clinic.   Orders placed this encounter:  No orders of the defined types were  placed in this encounter.    Derek Jack, MD [place] [phone]   I, Milinda Antis, am acting as a scribe for Dr. Sanda Linger.  I, Derek Jack MD, have reviewed the above documentation for accuracy and completeness, and I agree with the above.

## 2020-06-24 ENCOUNTER — Encounter (HOSPITAL_COMMUNITY): Payer: Self-pay

## 2020-06-24 NOTE — Progress Notes (Signed)
Attempted to contact patient to schedule LDCT. Unable to reach patient at this time, voicemail left asking for the patient to return my phone call.

## 2020-07-14 ENCOUNTER — Encounter (HOSPITAL_COMMUNITY): Payer: Self-pay

## 2020-07-14 NOTE — Progress Notes (Signed)
I have attempted to contact the patient once again regarding enrollment in the Advance. I left a detailed voicemail about the program and encouraged the patient to call me back for enrollment.

## 2020-08-14 ENCOUNTER — Encounter (HOSPITAL_COMMUNITY): Payer: Self-pay

## 2020-08-14 NOTE — Progress Notes (Signed)
Unable to reach patient regarding Lung Cancer Screening Program despite multiple attempts. I will close the referral out at this time. Referring provider aware. I have left a detailed VM for the patient and requested she call me back for program enrollment.

## 2020-10-04 DIAGNOSIS — I2510 Atherosclerotic heart disease of native coronary artery without angina pectoris: Secondary | ICD-10-CM | POA: Diagnosis not present

## 2020-10-04 DIAGNOSIS — E1129 Type 2 diabetes mellitus with other diabetic kidney complication: Secondary | ICD-10-CM | POA: Diagnosis not present

## 2020-10-04 DIAGNOSIS — I10 Essential (primary) hypertension: Secondary | ICD-10-CM | POA: Diagnosis not present

## 2020-10-04 DIAGNOSIS — E039 Hypothyroidism, unspecified: Secondary | ICD-10-CM | POA: Diagnosis not present

## 2020-11-13 DIAGNOSIS — E063 Autoimmune thyroiditis: Secondary | ICD-10-CM | POA: Diagnosis not present

## 2020-11-13 DIAGNOSIS — E1140 Type 2 diabetes mellitus with diabetic neuropathy, unspecified: Secondary | ICD-10-CM | POA: Diagnosis not present

## 2020-11-13 DIAGNOSIS — K219 Gastro-esophageal reflux disease without esophagitis: Secondary | ICD-10-CM | POA: Diagnosis not present

## 2020-11-13 DIAGNOSIS — I10 Essential (primary) hypertension: Secondary | ICD-10-CM | POA: Diagnosis not present

## 2020-11-13 DIAGNOSIS — F419 Anxiety disorder, unspecified: Secondary | ICD-10-CM | POA: Diagnosis not present

## 2020-11-13 DIAGNOSIS — L84 Corns and callosities: Secondary | ICD-10-CM | POA: Diagnosis not present

## 2020-11-13 DIAGNOSIS — Z6834 Body mass index (BMI) 34.0-34.9, adult: Secondary | ICD-10-CM | POA: Diagnosis not present

## 2020-12-09 ENCOUNTER — Inpatient Hospital Stay (HOSPITAL_COMMUNITY): Payer: PPO | Attending: Hematology

## 2020-12-16 ENCOUNTER — Ambulatory Visit (HOSPITAL_COMMUNITY): Payer: PPO | Admitting: Hematology

## 2021-01-03 DIAGNOSIS — I2510 Atherosclerotic heart disease of native coronary artery without angina pectoris: Secondary | ICD-10-CM | POA: Diagnosis not present

## 2021-01-03 DIAGNOSIS — I10 Essential (primary) hypertension: Secondary | ICD-10-CM | POA: Diagnosis not present

## 2021-01-03 DIAGNOSIS — E1129 Type 2 diabetes mellitus with other diabetic kidney complication: Secondary | ICD-10-CM | POA: Diagnosis not present

## 2021-01-03 DIAGNOSIS — E039 Hypothyroidism, unspecified: Secondary | ICD-10-CM | POA: Diagnosis not present

## 2021-01-05 DIAGNOSIS — E1129 Type 2 diabetes mellitus with other diabetic kidney complication: Secondary | ICD-10-CM | POA: Diagnosis not present

## 2021-01-05 DIAGNOSIS — I10 Essential (primary) hypertension: Secondary | ICD-10-CM | POA: Diagnosis not present

## 2021-01-05 DIAGNOSIS — E7849 Other hyperlipidemia: Secondary | ICD-10-CM | POA: Diagnosis not present

## 2021-02-25 DIAGNOSIS — E1142 Type 2 diabetes mellitus with diabetic polyneuropathy: Secondary | ICD-10-CM | POA: Diagnosis not present

## 2021-02-25 DIAGNOSIS — I10 Essential (primary) hypertension: Secondary | ICD-10-CM | POA: Diagnosis not present

## 2021-02-25 DIAGNOSIS — K219 Gastro-esophageal reflux disease without esophagitis: Secondary | ICD-10-CM | POA: Diagnosis not present

## 2021-02-25 DIAGNOSIS — Z6832 Body mass index (BMI) 32.0-32.9, adult: Secondary | ICD-10-CM | POA: Diagnosis not present

## 2021-02-25 DIAGNOSIS — F419 Anxiety disorder, unspecified: Secondary | ICD-10-CM | POA: Diagnosis not present

## 2021-02-25 DIAGNOSIS — F17200 Nicotine dependence, unspecified, uncomplicated: Secondary | ICD-10-CM | POA: Diagnosis not present

## 2021-02-25 DIAGNOSIS — E063 Autoimmune thyroiditis: Secondary | ICD-10-CM | POA: Diagnosis not present

## 2021-02-25 DIAGNOSIS — E1165 Type 2 diabetes mellitus with hyperglycemia: Secondary | ICD-10-CM | POA: Diagnosis not present

## 2021-02-25 DIAGNOSIS — E118 Type 2 diabetes mellitus with unspecified complications: Secondary | ICD-10-CM | POA: Diagnosis not present

## 2021-02-25 DIAGNOSIS — Z1331 Encounter for screening for depression: Secondary | ICD-10-CM | POA: Diagnosis not present

## 2021-02-25 DIAGNOSIS — E119 Type 2 diabetes mellitus without complications: Secondary | ICD-10-CM | POA: Diagnosis not present

## 2021-02-25 DIAGNOSIS — E7849 Other hyperlipidemia: Secondary | ICD-10-CM | POA: Diagnosis not present

## 2021-03-04 DIAGNOSIS — I10 Essential (primary) hypertension: Secondary | ICD-10-CM | POA: Diagnosis not present

## 2021-03-04 DIAGNOSIS — E1129 Type 2 diabetes mellitus with other diabetic kidney complication: Secondary | ICD-10-CM | POA: Diagnosis not present

## 2021-03-04 DIAGNOSIS — E7849 Other hyperlipidemia: Secondary | ICD-10-CM | POA: Diagnosis not present

## 2021-05-05 DIAGNOSIS — E7849 Other hyperlipidemia: Secondary | ICD-10-CM | POA: Diagnosis not present

## 2021-05-05 DIAGNOSIS — I10 Essential (primary) hypertension: Secondary | ICD-10-CM | POA: Diagnosis not present

## 2021-05-05 DIAGNOSIS — E1129 Type 2 diabetes mellitus with other diabetic kidney complication: Secondary | ICD-10-CM | POA: Diagnosis not present

## 2021-06-04 DIAGNOSIS — I10 Essential (primary) hypertension: Secondary | ICD-10-CM | POA: Diagnosis not present

## 2021-06-04 DIAGNOSIS — E782 Mixed hyperlipidemia: Secondary | ICD-10-CM | POA: Diagnosis not present

## 2021-06-04 DIAGNOSIS — E1129 Type 2 diabetes mellitus with other diabetic kidney complication: Secondary | ICD-10-CM | POA: Diagnosis not present

## 2021-06-23 ENCOUNTER — Other Ambulatory Visit: Payer: Self-pay | Admitting: Internal Medicine

## 2021-06-23 DIAGNOSIS — Z139 Encounter for screening, unspecified: Secondary | ICD-10-CM

## 2021-06-25 ENCOUNTER — Ambulatory Visit
Admission: RE | Admit: 2021-06-25 | Discharge: 2021-06-25 | Disposition: A | Discharge status: Home / Self Care | Payer: PPO | Source: Ambulatory Visit | Attending: Internal Medicine | Admitting: Internal Medicine

## 2021-06-25 ENCOUNTER — Other Ambulatory Visit: Payer: Self-pay

## 2021-06-25 DIAGNOSIS — Z139 Encounter for screening, unspecified: Secondary | ICD-10-CM

## 2021-06-25 DIAGNOSIS — Z1231 Encounter for screening mammogram for malignant neoplasm of breast: Secondary | ICD-10-CM | POA: Diagnosis not present

## 2021-10-06 DIAGNOSIS — E6609 Other obesity due to excess calories: Secondary | ICD-10-CM | POA: Diagnosis not present

## 2021-10-06 DIAGNOSIS — E1129 Type 2 diabetes mellitus with other diabetic kidney complication: Secondary | ICD-10-CM | POA: Diagnosis not present

## 2021-10-06 DIAGNOSIS — E063 Autoimmune thyroiditis: Secondary | ICD-10-CM | POA: Diagnosis not present

## 2021-10-06 DIAGNOSIS — E7849 Other hyperlipidemia: Secondary | ICD-10-CM | POA: Diagnosis not present

## 2021-10-06 DIAGNOSIS — Z0000 Encounter for general adult medical examination without abnormal findings: Secondary | ICD-10-CM | POA: Diagnosis not present

## 2021-10-06 DIAGNOSIS — Z1331 Encounter for screening for depression: Secondary | ICD-10-CM | POA: Diagnosis not present

## 2021-10-06 DIAGNOSIS — E782 Mixed hyperlipidemia: Secondary | ICD-10-CM | POA: Diagnosis not present

## 2021-10-06 DIAGNOSIS — Z6831 Body mass index (BMI) 31.0-31.9, adult: Secondary | ICD-10-CM | POA: Diagnosis not present

## 2021-10-06 DIAGNOSIS — I10 Essential (primary) hypertension: Secondary | ICD-10-CM | POA: Diagnosis not present

## 2021-10-06 DIAGNOSIS — F419 Anxiety disorder, unspecified: Secondary | ICD-10-CM | POA: Diagnosis not present

## 2021-10-06 DIAGNOSIS — E1140 Type 2 diabetes mellitus with diabetic neuropathy, unspecified: Secondary | ICD-10-CM | POA: Diagnosis not present

## 2021-10-08 DIAGNOSIS — E538 Deficiency of other specified B group vitamins: Secondary | ICD-10-CM | POA: Diagnosis not present

## 2021-10-28 ENCOUNTER — Other Ambulatory Visit (HOSPITAL_COMMUNITY): Payer: Self-pay | Admitting: Nephrology

## 2021-10-28 ENCOUNTER — Other Ambulatory Visit: Payer: Self-pay | Admitting: Nephrology

## 2021-10-28 DIAGNOSIS — R809 Proteinuria, unspecified: Secondary | ICD-10-CM | POA: Diagnosis not present

## 2021-10-28 DIAGNOSIS — Z716 Tobacco abuse counseling: Secondary | ICD-10-CM | POA: Diagnosis not present

## 2021-10-28 DIAGNOSIS — E1122 Type 2 diabetes mellitus with diabetic chronic kidney disease: Secondary | ICD-10-CM | POA: Diagnosis not present

## 2021-10-28 DIAGNOSIS — E1129 Type 2 diabetes mellitus with other diabetic kidney complication: Secondary | ICD-10-CM | POA: Diagnosis not present

## 2021-10-28 DIAGNOSIS — E8722 Chronic metabolic acidosis: Secondary | ICD-10-CM | POA: Diagnosis not present

## 2021-10-28 DIAGNOSIS — N189 Chronic kidney disease, unspecified: Secondary | ICD-10-CM | POA: Diagnosis not present

## 2021-10-28 DIAGNOSIS — N281 Cyst of kidney, acquired: Secondary | ICD-10-CM | POA: Diagnosis not present

## 2021-10-28 DIAGNOSIS — Z7689 Persons encountering health services in other specified circumstances: Secondary | ICD-10-CM | POA: Diagnosis not present

## 2021-10-28 DIAGNOSIS — I129 Hypertensive chronic kidney disease with stage 1 through stage 4 chronic kidney disease, or unspecified chronic kidney disease: Secondary | ICD-10-CM

## 2021-11-04 ENCOUNTER — Ambulatory Visit (HOSPITAL_COMMUNITY)
Admission: RE | Admit: 2021-11-04 | Discharge: 2021-11-04 | Disposition: A | Discharge status: Home / Self Care | Payer: PPO | Source: Ambulatory Visit | Attending: Nephrology | Admitting: Nephrology

## 2021-11-04 ENCOUNTER — Other Ambulatory Visit: Payer: Self-pay

## 2021-11-04 DIAGNOSIS — E1122 Type 2 diabetes mellitus with diabetic chronic kidney disease: Secondary | ICD-10-CM | POA: Diagnosis not present

## 2021-11-04 DIAGNOSIS — I129 Hypertensive chronic kidney disease with stage 1 through stage 4 chronic kidney disease, or unspecified chronic kidney disease: Secondary | ICD-10-CM | POA: Diagnosis not present

## 2021-11-04 DIAGNOSIS — N189 Chronic kidney disease, unspecified: Secondary | ICD-10-CM | POA: Diagnosis not present

## 2021-11-04 DIAGNOSIS — E1129 Type 2 diabetes mellitus with other diabetic kidney complication: Secondary | ICD-10-CM | POA: Insufficient documentation

## 2021-11-04 DIAGNOSIS — R809 Proteinuria, unspecified: Secondary | ICD-10-CM | POA: Diagnosis not present

## 2021-11-04 DIAGNOSIS — N281 Cyst of kidney, acquired: Secondary | ICD-10-CM | POA: Diagnosis not present

## 2021-11-09 DIAGNOSIS — E538 Deficiency of other specified B group vitamins: Secondary | ICD-10-CM | POA: Diagnosis not present

## 2022-01-04 ENCOUNTER — Ambulatory Visit (INDEPENDENT_AMBULATORY_CARE_PROVIDER_SITE_OTHER): Payer: PPO | Admitting: Adult Health

## 2022-01-04 ENCOUNTER — Encounter: Payer: Self-pay | Admitting: Adult Health

## 2022-01-04 ENCOUNTER — Other Ambulatory Visit: Payer: Self-pay

## 2022-01-04 ENCOUNTER — Other Ambulatory Visit (HOSPITAL_COMMUNITY)
Admission: RE | Admit: 2022-01-04 | Discharge: 2022-01-04 | Disposition: A | Discharge status: Home / Self Care | Payer: PPO | Source: Ambulatory Visit | Attending: Adult Health | Admitting: Adult Health

## 2022-01-04 VITALS — BP 170/100 | HR 88 | Ht 59.0 in | Wt 177.0 lb

## 2022-01-04 DIAGNOSIS — Z1211 Encounter for screening for malignant neoplasm of colon: Secondary | ICD-10-CM

## 2022-01-04 DIAGNOSIS — N816 Rectocele: Secondary | ICD-10-CM | POA: Insufficient documentation

## 2022-01-04 DIAGNOSIS — I10 Essential (primary) hypertension: Secondary | ICD-10-CM | POA: Diagnosis not present

## 2022-01-04 DIAGNOSIS — Z1151 Encounter for screening for human papillomavirus (HPV): Secondary | ICD-10-CM | POA: Insufficient documentation

## 2022-01-04 DIAGNOSIS — Z01419 Encounter for gynecological examination (general) (routine) without abnormal findings: Secondary | ICD-10-CM

## 2022-01-04 LAB — HEMOCCULT GUIAC POC 1CARD (OFFICE): Fecal Occult Blood, POC: NEGATIVE

## 2022-01-04 NOTE — Progress Notes (Signed)
Patient ID: Gabrielle Clay, female   DOB: 17-Mar-1951, 71 y.o.   MRN: [id number] History of Present Illness: Gabrielle Clay is a 71 year old white female,married, PM, she is a new pt, in requesting well woman gyn exam and pap.  She had pap about 2012 last. She said about a month ago felt like something was falling out, better now. Her brother died this month and she has been helping with his estate. She has done some heavy lifting.  PCP is Dr Gerarda Fraction.    Current Medications, Allergies, Past Medical History, Past Surgical History, Family History and Social History were reviewed in [facility] record.     Review of Systems: Patient denies any headaches, hearing loss, fatigue, blurred vision, shortness of breath, chest pain, abdominal pain, problems with bowel movements, urination(occasional SUI, with sneezing), or intercourse(not active in over 5 years). No joint pain or mood swings.  See HPI for positive.   Physical Exam:BP (!) 170/100 (BP Location: Right Arm, Patient Position: Sitting, Cuff Size: Large)    Pulse 88    Ht 4\' 11"  (1.499 m)    Wt 177 lb (80.3 kg)    BMI 35.75 kg/m   General:  Well developed, well nourished, no acute distress Skin:  Warm and dry Neck:  Midline trachea, normal thyroid, good ROM, no lymphadenopathy,no carotid bruits heard  Lungs; Clear to auscultation bilaterally Breast:  No dominant palpable mass, retraction, or nipple discharge Cardiovascular: Regular rate and rhythm Abdomen:  Soft, non tender, no hepatosplenomegaly Pelvic:  External genitalia is normal in appearance, no lesions.  The vagina is pale with loss of rugae. Urethra has no lesions or masses. The cervix is smooth and atrophic, pap with HR HPV performed.   Uterus is felt to be normal size, shape, and contour.  No adnexal masses or tenderness noted.Bladder is non tender, no masses felt. Rectal: Good sphincter tone, no polyps, or hemorrhoids felt.  Hemoccult negative.+low  rectocele Extremities/musculoskeletal:  No swelling,+ varicosities noted, no clubbing or cyanosis Psych:  No mood changes, alert and cooperative,seems happy AA is 0  Fall risk is low Depression screen [facility] 2/9 01/04/2022  Decreased Interest 0  Down, Depressed, Hopeless 0  PHQ - 2 Score 0  Altered sleeping 0  Tired, decreased energy 0  Change in appetite 0  Feeling bad or failure about yourself  0  Trouble concentrating 0  Moving slowly or fidgety/restless 0  Suicidal thoughts 0  PHQ-9 Score 0    GAD 7 : Generalized Anxiety Score 01/04/2022  Nervous, Anxious, on Edge 0  Control/stop worrying 0  Worry too much - different things 0  Trouble relaxing 0  Restless 0  Easily annoyed or irritable 0  Afraid - awful might happen 0  Total GAD 7 Score 0    Upstream - 01/04/22 1335       Pregnancy Intention Screening   Does the patient want to become pregnant in the next year? N/A    Does the patient's partner want to become pregnant in the next year? N/A    Would the patient like to discuss contraceptive options today? N/A      Contraception Wrap Up   Current Method Female Sterilization   postmenopausal   End Method Female Sterilization   postmenopausal   Contraception Counseling Provided No            Examination chaperoned by Levy Pupa LPN     Impression and Plan: 1. Encounter for gynecological examination with Papanicolaou  smear of cervix Pap sent No more paps needed Physical with PCP Labs with PCP Mammogram every 1-2 years   2. Encounter for screening fecal occult blood testing   3. Rectocele Gave Medical Explainer#4 and discussed rectocele, No heavy lifting Do not strain with BM  4. Hypertension, unspecified type Take BP meds daily Follow up with PCP

## 2022-01-07 LAB — CYTOLOGY - PAP
Adequacy: ABSENT
Comment: NEGATIVE
Diagnosis: NEGATIVE
High risk HPV: NEGATIVE

## 2022-03-17 DIAGNOSIS — E538 Deficiency of other specified B group vitamins: Secondary | ICD-10-CM | POA: Diagnosis not present

## 2022-06-30 DIAGNOSIS — E1165 Type 2 diabetes mellitus with hyperglycemia: Secondary | ICD-10-CM | POA: Diagnosis not present

## 2022-06-30 DIAGNOSIS — D518 Other vitamin B12 deficiency anemias: Secondary | ICD-10-CM | POA: Diagnosis not present

## 2022-06-30 DIAGNOSIS — E559 Vitamin D deficiency, unspecified: Secondary | ICD-10-CM | POA: Diagnosis not present

## 2022-06-30 DIAGNOSIS — Z1331 Encounter for screening for depression: Secondary | ICD-10-CM | POA: Diagnosis not present

## 2022-06-30 DIAGNOSIS — Z0001 Encounter for general adult medical examination with abnormal findings: Secondary | ICD-10-CM | POA: Diagnosis not present

## 2022-06-30 DIAGNOSIS — E6609 Other obesity due to excess calories: Secondary | ICD-10-CM | POA: Diagnosis not present

## 2022-06-30 DIAGNOSIS — Z6833 Body mass index (BMI) 33.0-33.9, adult: Secondary | ICD-10-CM | POA: Diagnosis not present

## 2022-06-30 DIAGNOSIS — N183 Chronic kidney disease, stage 3 unspecified: Secondary | ICD-10-CM | POA: Diagnosis not present

## 2022-06-30 DIAGNOSIS — E039 Hypothyroidism, unspecified: Secondary | ICD-10-CM | POA: Diagnosis not present

## 2022-06-30 DIAGNOSIS — I10 Essential (primary) hypertension: Secondary | ICD-10-CM | POA: Diagnosis not present

## 2022-07-02 ENCOUNTER — Ambulatory Visit
Admission: RE | Admit: 2022-07-02 | Discharge: 2022-07-02 | Disposition: A | Discharge status: Home / Self Care | Payer: PPO | Source: Ambulatory Visit | Attending: Internal Medicine | Admitting: Internal Medicine

## 2022-07-02 ENCOUNTER — Other Ambulatory Visit: Payer: Self-pay | Admitting: Internal Medicine

## 2022-07-02 DIAGNOSIS — Z1231 Encounter for screening mammogram for malignant neoplasm of breast: Secondary | ICD-10-CM

## 2022-07-06 ENCOUNTER — Other Ambulatory Visit: Payer: Self-pay | Admitting: Internal Medicine

## 2022-07-06 DIAGNOSIS — R928 Other abnormal and inconclusive findings on diagnostic imaging of breast: Secondary | ICD-10-CM

## 2022-07-10 ENCOUNTER — Ambulatory Visit
Admission: RE | Admit: 2022-07-10 | Discharge: 2022-07-10 | Disposition: A | Discharge status: Home / Self Care | Payer: PPO | Source: Ambulatory Visit | Attending: Internal Medicine | Admitting: Internal Medicine

## 2022-07-10 DIAGNOSIS — R928 Other abnormal and inconclusive findings on diagnostic imaging of breast: Secondary | ICD-10-CM

## 2022-08-02 DIAGNOSIS — L570 Actinic keratosis: Secondary | ICD-10-CM | POA: Diagnosis not present

## 2022-08-02 DIAGNOSIS — X32XXXD Exposure to sunlight, subsequent encounter: Secondary | ICD-10-CM | POA: Diagnosis not present

## 2022-08-30 DIAGNOSIS — L570 Actinic keratosis: Secondary | ICD-10-CM | POA: Diagnosis not present

## 2022-08-30 DIAGNOSIS — X32XXXD Exposure to sunlight, subsequent encounter: Secondary | ICD-10-CM | POA: Diagnosis not present

## 2022-09-09 DIAGNOSIS — E1165 Type 2 diabetes mellitus with hyperglycemia: Secondary | ICD-10-CM | POA: Diagnosis not present

## 2022-12-13 DIAGNOSIS — L570 Actinic keratosis: Secondary | ICD-10-CM | POA: Diagnosis not present

## 2022-12-13 DIAGNOSIS — X32XXXD Exposure to sunlight, subsequent encounter: Secondary | ICD-10-CM | POA: Diagnosis not present

## 2023-01-10 DIAGNOSIS — X32XXXD Exposure to sunlight, subsequent encounter: Secondary | ICD-10-CM | POA: Diagnosis not present

## 2023-01-10 DIAGNOSIS — L570 Actinic keratosis: Secondary | ICD-10-CM | POA: Diagnosis not present

## 2023-07-04 DIAGNOSIS — Z0000 Encounter for general adult medical examination without abnormal findings: Secondary | ICD-10-CM | POA: Diagnosis not present

## 2023-07-04 DIAGNOSIS — E6609 Other obesity due to excess calories: Secondary | ICD-10-CM | POA: Diagnosis not present

## 2023-07-04 DIAGNOSIS — Z6832 Body mass index (BMI) 32.0-32.9, adult: Secondary | ICD-10-CM | POA: Diagnosis not present

## 2023-07-04 DIAGNOSIS — E559 Vitamin D deficiency, unspecified: Secondary | ICD-10-CM | POA: Diagnosis not present

## 2023-07-04 DIAGNOSIS — N183 Chronic kidney disease, stage 3 unspecified: Secondary | ICD-10-CM | POA: Diagnosis not present

## 2023-07-04 DIAGNOSIS — D518 Other vitamin B12 deficiency anemias: Secondary | ICD-10-CM | POA: Diagnosis not present

## 2023-07-04 DIAGNOSIS — I10 Essential (primary) hypertension: Secondary | ICD-10-CM | POA: Diagnosis not present

## 2023-07-04 DIAGNOSIS — E1140 Type 2 diabetes mellitus with diabetic neuropathy, unspecified: Secondary | ICD-10-CM | POA: Diagnosis not present

## 2023-07-04 DIAGNOSIS — Z0001 Encounter for general adult medical examination with abnormal findings: Secondary | ICD-10-CM | POA: Diagnosis not present

## 2023-07-04 DIAGNOSIS — E1165 Type 2 diabetes mellitus with hyperglycemia: Secondary | ICD-10-CM | POA: Diagnosis not present

## 2023-07-04 DIAGNOSIS — Z1331 Encounter for screening for depression: Secondary | ICD-10-CM | POA: Diagnosis not present

## 2023-07-04 DIAGNOSIS — E1129 Type 2 diabetes mellitus with other diabetic kidney complication: Secondary | ICD-10-CM | POA: Diagnosis not present

## 2023-07-04 DIAGNOSIS — E039 Hypothyroidism, unspecified: Secondary | ICD-10-CM | POA: Diagnosis not present

## 2023-07-26 ENCOUNTER — Encounter (HOSPITAL_COMMUNITY): Payer: Self-pay | Admitting: Emergency Medicine

## 2023-07-26 ENCOUNTER — Other Ambulatory Visit: Payer: Self-pay

## 2023-07-26 ENCOUNTER — Emergency Department (HOSPITAL_COMMUNITY)
Admission: EM | Admit: 2023-07-26 | Discharge: 2023-07-26 | Disposition: A | Discharge status: Home / Self Care | Payer: PPO | Attending: Emergency Medicine | Admitting: Emergency Medicine

## 2023-07-26 ENCOUNTER — Emergency Department (HOSPITAL_COMMUNITY): Payer: PPO

## 2023-07-26 DIAGNOSIS — S42252A Displaced fracture of greater tuberosity of left humerus, initial encounter for closed fracture: Secondary | ICD-10-CM | POA: Insufficient documentation

## 2023-07-26 DIAGNOSIS — I10 Essential (primary) hypertension: Secondary | ICD-10-CM | POA: Diagnosis not present

## 2023-07-26 DIAGNOSIS — S4992XA Unspecified injury of left shoulder and upper arm, initial encounter: Secondary | ICD-10-CM | POA: Diagnosis present

## 2023-07-26 DIAGNOSIS — E119 Type 2 diabetes mellitus without complications: Secondary | ICD-10-CM | POA: Insufficient documentation

## 2023-07-26 DIAGNOSIS — W010XXA Fall on same level from slipping, tripping and stumbling without subsequent striking against object, initial encounter: Secondary | ICD-10-CM | POA: Diagnosis not present

## 2023-07-26 DIAGNOSIS — S42212A Unspecified displaced fracture of surgical neck of left humerus, initial encounter for closed fracture: Secondary | ICD-10-CM

## 2023-07-26 DIAGNOSIS — S129XXA Fracture of neck, unspecified, initial encounter: Secondary | ICD-10-CM | POA: Diagnosis not present

## 2023-07-26 DIAGNOSIS — S4292XA Fracture of left shoulder girdle, part unspecified, initial encounter for closed fracture: Secondary | ICD-10-CM | POA: Diagnosis not present

## 2023-07-26 DIAGNOSIS — Z7982 Long term (current) use of aspirin: Secondary | ICD-10-CM | POA: Insufficient documentation

## 2023-07-26 DIAGNOSIS — Z7984 Long term (current) use of oral hypoglycemic drugs: Secondary | ICD-10-CM | POA: Insufficient documentation

## 2023-07-26 DIAGNOSIS — S42222A 2-part displaced fracture of surgical neck of left humerus, initial encounter for closed fracture: Secondary | ICD-10-CM | POA: Diagnosis not present

## 2023-07-26 LAB — CBG MONITORING, ED: Glucose-Capillary: 188 mg/dL — ABNORMAL HIGH (ref 70–99)

## 2023-07-26 MED ORDER — NAPROXEN 500 MG PO TABS: 500.0000 mg | ORAL_TABLET | Freq: Two times a day (BID) | ORAL | 0 refills | Status: DC

## 2023-07-26 NOTE — ED Triage Notes (Addendum)
Pt tripped over mop pta and c/o left arm pain from elbow to shoulder. Pt states she felt something pop. Pain only with moving. Denies hitting head/loc/n/v. States knees are sore. Radial pulses present. No obvious deformity noted. Tender with palpation from elbow to top of arm. Denies pain with palpation to c spine.  ROM limited due to pain Had motrin 600mg  at 7pm

## 2023-07-26 NOTE — ED Provider Notes (Signed)
[place] EMERGENCY DEPARTMENT AT [facility] Provider Note   CSN: [id number] Arrival date & time: 07/26/23  1949     History  Chief Complaint  Patient presents with   Fall   Arm Pain    Gabrielle Clay is a 72 y.o. female.   Fall  Arm Pain     This patient is a very pleasant 72 year old female, she does take a baby aspirin, she is a diabetic and has hypertension, good about taking her medications.  Reports that she was at home tonight when she was carrying 2 bags of groceries, she tripped over a mop and fell directly onto her left shoulder.  She felt a pop and has had pain since that time.  She has pain with movement of the left shoulder, initially she had some tingling in her fingers but that has resolved and her hand feels back to normal.  This occurred 2 hours prior to arrival  Home Medications Prior to Admission medications   Medication Sig Start Date End Date Taking? Authorizing Provider  naproxen (NAPROSYN) 500 MG tablet Take 1 tablet (500 mg total) by mouth 2 (two) times daily with a meal. 07/26/23  Yes Eber Hong, MD  aspirin 81 MG tablet Take 81 mg by mouth daily.    [provider]  chlorthalidone (HYGROTON) 25 MG tablet Take 25 mg by mouth daily.    [provider]  Flaxseed, Linseed, (FLAX SEED OIL PO) Take by mouth.    [provider]  gemfibrozil (LOPID) 600 MG tablet Take 600 mg by mouth 2 (two) times daily before a meal.    [provider]  glipiZIDE (GLUCOTROL XL) 10 MG 24 hr tablet Take 10 mg by mouth in the morning and at bedtime.    [provider]  levothyroxine (SYNTHROID, LEVOTHROID) 50 MCG tablet Take 50 mcg by mouth daily before breakfast.  03/10/15   [provider]  LOSARTAN POTASSIUM PO Take 100 mg by mouth daily.    [provider]  METOPROLOL TARTRATE PO Take 25 mg by mouth in the morning and at bedtime.    [provider]  Omega-3 Fatty Acids (FISH OIL) 1000 MG  CAPS Take 1,000 mg by mouth 2 (two) times daily.    [provider]  pantoprazole (PROTONIX) 40 MG tablet Take 40 mg by mouth daily.  05/24/15   [provider]      Allergies    Amlodipine and Sulfa antibiotics    Review of Systems   Review of Systems  Musculoskeletal:  Positive for arthralgias.  Skin:  Negative for rash and wound.  Neurological:  Negative for weakness and numbness.    Physical Exam Updated Vital Signs BP (!) 176/96 (BP Location: Right Wrist)   Pulse 90   Temp 98 F (36.7 C) (Oral)   Resp 17   SpO2 100%  Physical Exam Vitals and nursing note reviewed.  Constitutional:      Appearance: She is well-developed. She is not diaphoretic.  HENT:     Head: Normocephalic and atraumatic.  Eyes:     General:        Right eye: No discharge.        Left eye: No discharge.     Conjunctiva/sclera: Conjunctivae normal.  Pulmonary:     Effort: Pulmonary effort is normal. No respiratory distress.  Musculoskeletal:        General: Tenderness present.     Comments: The patient has good range of  motion of all the major joints of the bilateral upper extremities except for the left shoulder where she has pain with any movement of the shoulder.  There is mild tenderness around the shoulder but no obvious deformities or bruising.  No tenderness over the clavicles, totally normal elbow and wrist on the left.  Normal grip on the left  Skin:    General: Skin is warm and dry.     Findings: No erythema or rash.  Neurological:     Mental Status: She is alert.     Coordination: Coordination normal.     Comments: Normal strength and sensation to the left upper extremity in its entirety     ED Results / Procedures / Treatments   Labs (all labs ordered are listed, but only abnormal results are displayed) Labs Reviewed  CBG MONITORING, ED - Abnormal; Notable for the following components:      Result Value   Glucose-Capillary 188 (*)    All other components within  normal limits    EKG None  Radiology No results found.  Procedures Procedures    Medications Ordered in ED Medications - No data to display  ED Course/ Medical Decision Making/ A&P                                 Medical Decision Making Amount and/or Complexity of Data Reviewed Radiology: ordered.  Risk Prescription drug management.   No signs of trauma to the head or the neck, she has a focal injury to the left shoulder, x-ray will be obtained of the shoulder and the humerus to make sure there is no other obvious injuries but she has good range of motion without any tenderness around the elbow.  This is to both flexion extension pronation and supination.  Suspect there is either contusion or proximal humeral fracture, patient is agreeable to a sling  Imaging: I personally viewed and interpreted the x-ray of the left shoulder which shows a displaced proximal humerus fracture.  The patient was informed of these results, I showed her the imaging, she expressed her understanding to the need for a sling and anti-inflammatories, she is stable for discharge and agreeable to follow-up in the outpatient setting, given orthopedic phone number  Family members at the bedside are agreeable to assist and help the patient get to her appointment        Final Clinical Impression(s) / ED Diagnoses Final diagnoses:  Closed displaced fracture of surgical neck of left humerus, unspecified fracture morphology, initial encounter    Rx / DC Orders ED Discharge Orders          Ordered    naproxen (NAPROSYN) 500 MG tablet  2 times daily with meals        07/26/23 2056              Eber Hong, MD 07/26/23 2057

## 2023-07-26 NOTE — ED Notes (Signed)
ED Provider at bedside. 

## 2023-07-26 NOTE — ED Notes (Signed)
Patient transported to X-ray 

## 2023-07-26 NOTE — Discharge Instructions (Signed)
Please call the office of the orthopedic surgeon listed above to be seen sometime within the next 7 to 10 days.  You may take Naprosyn twice a day for pain, use the sling to help keep your arm in place, you may take this off to take a shower.  ER for severe worsening symptoms including increasing swelling pain numbness or weakness.

## 2023-08-03 ENCOUNTER — Other Ambulatory Visit (INDEPENDENT_AMBULATORY_CARE_PROVIDER_SITE_OTHER): Payer: PPO

## 2023-08-03 ENCOUNTER — Encounter: Payer: Self-pay | Admitting: Orthopedic Surgery

## 2023-08-03 ENCOUNTER — Ambulatory Visit: Payer: PPO | Admitting: Orthopedic Surgery

## 2023-08-03 VITALS — BP 166/93 | HR 85 | Ht 59.0 in | Wt 177.0 lb

## 2023-08-03 DIAGNOSIS — S42202A Unspecified fracture of upper end of left humerus, initial encounter for closed fracture: Secondary | ICD-10-CM | POA: Diagnosis not present

## 2023-08-03 NOTE — Progress Notes (Signed)
New Patient Visit  Assessment: Gabrielle Clay is a 72 y.o. female with the following: 1. Closed fracture of proximal end of left humerus, initial encounter  Plan: Gabrielle Clay fell and sustained a proximal humerus fracture through the surgical neck.  There is medial displacement of the shaft.  Glenohumeral joint is reduced.  Updated right radiographs obtained today are without further displacement.  These were reviewed with the patient in clinic today.  She is not interested in surgery.  I think it is reasonable to continue with nonoperative management.  She will remain in the sling.  She will continue to take naproxen as needed, which is providing sufficient relief.  I would like to see her in 1 week for repeat x-rays.  Follow-up: Return in about 1 week (around 08/10/2023).  Subjective:  Chief Complaint  Patient presents with   Shoulder Pain    Left     History of Present Illness: Gabrielle Clay is a 72 y.o. female who presents for evaluation of left shoulder pain.  She is right-hand dominant.  She was carrying boxes approximate 1 week ago, when she tripped and fell in a mop.  She had immediate pain in her left shoulder.  She presented to the emergency department.  In the ED, radiographs demonstrated proximal humerus fracture.  She was placed in a sling.  She was offered pain medication, but that she did not want strong narcotics.  She has been taking naproxen as needed.  No numbness or tingling.   Review of Systems: No fevers or chills No numbness or tingling No chest pain No shortness of breath No bowel or bladder dysfunction No GI distress No headaches   Medical History:  Past Medical History:  Diagnosis Date   GERD (gastroesophageal reflux disease)    Hypertension    Low back pain    Skin cancer    Thyroid disease    hypothyroidism    Past Surgical History:  Procedure Laterality Date   BREAST BIOPSY     long time ago-pt doesn't remember- no scar   TUBAL  LIGATION      Family History  Problem Relation Age of Onset   Hypertension Father    Stroke Mother    Heart block Mother    Diabetes Brother    Hypertension Brother    Breast cancer Son    Breast cancer Paternal Aunt    Social History   Tobacco Use   Smoking status: Every Day    Current packs/day: 1.00    Average packs/day: 1 pack/day for 30.0 years (30.0 ttl pk-yrs)    Types: Cigarettes   Smokeless tobacco: Never  Vaping Use   Vaping status: Never Used  Substance Use Topics   Alcohol use: No    Alcohol/week: 0.0 standard drinks of alcohol   Drug use: No    Allergies  Allergen Reactions   Amlodipine Swelling    Swelling of feet   Sulfa Antibiotics Nausea Only    Current Meds  Medication Sig   aspirin 81 MG tablet Take 81 mg by mouth daily.   chlorthalidone (HYGROTON) 25 MG tablet Take 25 mg by mouth daily.   Flaxseed, Linseed, (FLAX SEED OIL PO) Take by mouth.   gemfibrozil (LOPID) 600 MG tablet Take 600 mg by mouth 2 (two) times daily before a meal.   glipiZIDE (GLUCOTROL XL) 10 MG 24 hr tablet Take 10 mg by mouth in the morning and at bedtime.   levothyroxine (SYNTHROID, LEVOTHROID) 50  MCG tablet Take 50 mcg by mouth daily before breakfast.    LOSARTAN POTASSIUM PO Take 100 mg by mouth daily.   METOPROLOL TARTRATE PO Take 25 mg by mouth in the morning and at bedtime.   naproxen (NAPROSYN) 500 MG tablet Take 1 tablet (500 mg total) by mouth 2 (two) times daily with a meal.   Omega-3 Fatty Acids (FISH OIL) 1000 MG CAPS Take 1,000 mg by mouth 2 (two) times daily.   pantoprazole (PROTONIX) 40 MG tablet Take 40 mg by mouth daily.     Objective: BP (!) 166/93   Pulse 85   Ht 4\' 11"  (1.499 m)   Wt 177 lb (80.3 kg)   BMI 35.75 kg/m   Physical Exam:  General: Elderly female., Alert and oriented., and No acute distress. Gait: Normal gait.  Evaluation left shoulder demonstrates ecchymosis over the anterior shoulder, extending to her chest.  Tenderness palpation  of the shoulder.  Sensation intact in the axillary nerve distribution.  She is wiggling all fingers.  Sensation is intact throughout the left hand.  2+ radial pulse.  IMAGING: I personally ordered and reviewed the following images  X-rays of the left shoulder were obtained in clinic today.  There is a fracture through the surgical neck, with medial displacement of the shaft.  Compared to prior x-rays, there has been limited change.  Glenohumeral joint is reduced.  No bony lesions.  Impression: Left proximal humerus fracture in unchanged alignment   New Medications:  No orders of the defined types were placed in this encounter.     Oliver Barre, MD  08/03/2023 3:00 PM

## 2023-08-10 ENCOUNTER — Other Ambulatory Visit (INDEPENDENT_AMBULATORY_CARE_PROVIDER_SITE_OTHER): Payer: PPO

## 2023-08-10 ENCOUNTER — Ambulatory Visit: Payer: PPO | Admitting: Orthopedic Surgery

## 2023-08-10 ENCOUNTER — Encounter: Payer: Self-pay | Admitting: Orthopedic Surgery

## 2023-08-10 DIAGNOSIS — S42292D Other displaced fracture of upper end of left humerus, subsequent encounter for fracture with routine healing: Secondary | ICD-10-CM

## 2023-08-10 DIAGNOSIS — S42202A Unspecified fracture of upper end of left humerus, initial encounter for closed fracture: Secondary | ICD-10-CM

## 2023-08-10 MED ORDER — NAPROXEN 500 MG PO TABS: 500.0000 mg | ORAL_TABLET | Freq: Two times a day (BID) | ORAL | 0 refills | Status: DC

## 2023-08-10 NOTE — Progress Notes (Signed)
   Return patient Visit  Assessment: Gabrielle Clay is a 72 y.o. female with the following: 1. Closed fracture of proximal end of left humerus, subsequent encounter  Plan: [name] fell and sustained a proximal humerus fracture through the surgical neck.  Radiographs remain unchanged.  Her pain is controlled.  She is not interested in surgery.  She this will heal, and her motion and function improved.  She is aware that she may not regain full motion.  She is okay with this.  Continue with nonoperative management.  Continue use of the sling.  Provided refills naproxen.  Follow-up 2 weeks.   Follow-up: Return in about 2 weeks (around 08/24/2023).  Subjective:  Chief Complaint  Patient presents with   Fracture    L shoulder DOI 07/26/23    History of Present Illness: Gabrielle Clay is a 72 y.o. female who returns for evaluation of left shoulder pain.  She is right-hand dominant.  She sustained a left proximal humerus fracture approximately 2 weeks ago.  She has remained in a sling.  She is using naproxen, but needs a refill.  She has no numbness or tingling.  She is not interested in surgery.  Review of Systems: No fevers or chills No numbness or tingling No chest pain No shortness of breath No bowel or bladder dysfunction No GI distress No headaches      Objective: There were no vitals taken for this visit.  Physical Exam:  General: Elderly female., Alert and oriented., and No acute distress. Gait: Normal gait.  Left shoulder without obvious deformity.  Bruising over the anterior shoulder, extending under chest.  Sensation is intact in the axillary nerve distribution.  Fingers are warm and well-perfused.  2+ radial pulse.  Patient is intact throughout the left hand.  IMAGING: I personally ordered and reviewed the following images  X-rays of the left shoulder were obtained in clinic today.  These are compared to prior x-rays.  There is a fracture through  the surgical neck, with medial displacement of the shaft.  Compared to prior x-rays, there has been no interval displacement.  The glenohumeral joint is reduced.  No bony lesions.  Impression: Left proximal humerus fracture, with medial displacement of the shaft stable compared to prior x-rays   New Medications:  Meds ordered this encounter  Medications   naproxen (NAPROSYN) 500 MG tablet    Sig: Take 1 tablet (500 mg total) by mouth 2 (two) times daily with a meal.    Dispense:  30 tablet    Refill:  0      Oliver Barre, MD  08/10/2023 9:16 AM

## 2023-08-24 ENCOUNTER — Encounter: Payer: Self-pay | Admitting: Orthopedic Surgery

## 2023-08-24 ENCOUNTER — Ambulatory Visit: Payer: PPO | Admitting: Orthopedic Surgery

## 2023-08-24 ENCOUNTER — Other Ambulatory Visit (INDEPENDENT_AMBULATORY_CARE_PROVIDER_SITE_OTHER): Payer: PPO

## 2023-08-24 DIAGNOSIS — S42292D Other displaced fracture of upper end of left humerus, subsequent encounter for fracture with routine healing: Secondary | ICD-10-CM

## 2023-08-24 NOTE — Patient Instructions (Signed)
Pendulum   Stand near a wall or a surface that you can hold onto for balance. Bend at the waist and let your left / right arm hang straight down. Use your other arm to support you. Keep your back straight and do not lock your knees. Relax your left / right arm and shoulder muscles, and move your hips and your trunk so your left / right arm swings freely. Your arm should swing because of the motion of your body, not because you are using your arm or shoulder muscles. Keep moving your hips and trunk so your arm swings in the following directions, as told by your health care provider: Side to side. Forward and backward. In clockwise and counterclockwise circles. Continue each motion for 20 seconds, or for as long as told by your health care provider. Slowly return to the starting position.  Repeat 10 times. Complete this exercise daily.

## 2023-08-24 NOTE — Progress Notes (Signed)
   Return patient Visit  Assessment: Gabrielle Clay is a 72 y.o. female with the following: 1. Closed fracture of proximal end of left humerus, subsequent encounter  Plan: [name] fell and sustained a proximal humerus fracture through the surgical neck.  Pain is improving.  Radiographs have not changed.  She is not interested in surgery.  Okay to remove the sling when at home.  Initiate gentle range of motion of the elbow, wrist and hand.  She will start pendulum exercises.  Follow-up in 2 weeks, at which time I anticipate we will remove the sling.  Follow-up: Return in about 2 weeks (around 09/07/2023).  Subjective:  Chief Complaint  Patient presents with   Fracture    L shoulder DOI 07/26/23    History of Present Illness: Gabrielle Clay is a 72 y.o. female who returns for evaluation of left shoulder pain.  She is right-hand dominant.  She sustained a left proximal humerus fracture approximately 4 weeks ago.  She continues to wear a sling.  She is no longer taking medications on a consistent basis for her left shoulder.  She denies numbness and tingling.  She feels better.  She does not want to proceed with surgery.   Review of Systems: No fevers or chills No numbness or tingling No chest pain No shortness of breath No bowel or bladder dysfunction No GI distress No headaches      Objective: There were no vitals taken for this visit.  Physical Exam:  General: Elderly female., Alert and oriented., and No acute distress. Gait: Normal gait.  Left shoulder without obvious deformity.  Minimal residual ecchymosis over the anterior shoulder.  Sensation is intact in the axillary nerve distribution.  Fingers are warm and well-perfused.  2+ radial pulse.  Patient is intact throughout the left hand.  IMAGING: I personally ordered and reviewed the following images  X-rays of the left shoulder obtained in clinic today.  These are compared to prior x-rays.   Displaced fracture of the surgical neck.  The shaft has displaced medially.  There is some early signs of callus formation.  No interval displacement.  No additional injuries.  Glenohumeral joint is reduced.  Impression: Stable left proximal humerus fracture, with medial displacement of the humeral shaft.  New Medications:  No orders of the defined types were placed in this encounter.     Oliver Barre, MD  08/24/2023 10:14 AM

## 2023-09-07 ENCOUNTER — Encounter: Payer: Self-pay | Admitting: Orthopedic Surgery

## 2023-09-07 ENCOUNTER — Other Ambulatory Visit (INDEPENDENT_AMBULATORY_CARE_PROVIDER_SITE_OTHER): Payer: Self-pay

## 2023-09-07 ENCOUNTER — Ambulatory Visit (INDEPENDENT_AMBULATORY_CARE_PROVIDER_SITE_OTHER): Payer: PPO | Admitting: Orthopedic Surgery

## 2023-09-07 DIAGNOSIS — S42292D Other displaced fracture of upper end of left humerus, subsequent encounter for fracture with routine healing: Secondary | ICD-10-CM

## 2023-09-07 NOTE — Progress Notes (Signed)
   Return patient Visit  Assessment: Gabrielle Clay is a 72 y.o. female with the following: 1. Closed fracture of proximal end of left humerus, subsequent encounter  Plan: [name] fell and sustained a proximal humerus fracture through the surgical neck.  Radiographs remain unchanged, although there is almost complete displacement of the fracture fragments.  Nonetheless, her pain is much better.  She tolerates gentle range of motion.  At this point, she can remove the sling, initiate passive range of motion.  As her passive range of motion improves, she can gradually increase the level of her activities.  I will see her back in approximately 1 month.   Follow-up: Return in about 4 weeks (around 10/05/2023).  Subjective:  Chief Complaint  Patient presents with   Fracture    L shoulder DOI 07/26/23    History of Present Illness: Gabrielle Clay is a 72 y.o. female who returns for evaluation of left shoulder pain.  She is right-hand dominant.  She sustained a left proximal humerus fracture approximately 6 weeks ago.  Since I last saw her, she has been coming out of the sling.  She has been doing some gentle range of motion below the level of her shoulder.  She states that she has had no issues.  She is taking Aleve, up to once per day.  Her pain is better.  No numbness or tingling.  She wants to avoid surgery.  Review of Systems: No fevers or chills No numbness or tingling No chest pain No shortness of breath No bowel or bladder dysfunction No GI distress No headaches      Objective: There were no vitals taken for this visit.  Physical Exam:  General: Elderly female., Alert and oriented., and No acute distress. Gait: Normal gait.  Left shoulder without obvious deformity.  No gross motion in the proximal humerus.  No bruising.  Sensation is intact in the axillary nerve distribution.  Sensation intact throughout the left hand.  She tolerates it 100 degrees of  passive abduction.  100 degrees of forward flexion.  External rotation or side of 35 to 40 degrees.  IMAGING: I personally ordered and reviewed the following images  X-rays of the left shoulder were obtained in clinic today.  These are compared available x-rays.  Displaced fracture of the surgical neck is once again visualized.  There is no obvious change in overall alignment.  There is near complete displacement of the humeral shaft medially.  Glenohumeral joint is reduced.  Impression: Stable left proximal humerus fracture without interval displacement  New Medications:  No orders of the defined types were placed in this encounter.     Oliver Barre, MD  09/07/2023 10:03 AM

## 2023-10-05 ENCOUNTER — Ambulatory Visit (INDEPENDENT_AMBULATORY_CARE_PROVIDER_SITE_OTHER): Payer: PPO | Admitting: Orthopedic Surgery

## 2023-10-05 ENCOUNTER — Other Ambulatory Visit (INDEPENDENT_AMBULATORY_CARE_PROVIDER_SITE_OTHER): Payer: PPO

## 2023-10-05 ENCOUNTER — Encounter: Payer: Self-pay | Admitting: Orthopedic Surgery

## 2023-10-05 DIAGNOSIS — S42292D Other displaced fracture of upper end of left humerus, subsequent encounter for fracture with routine healing: Secondary | ICD-10-CM | POA: Diagnosis not present

## 2023-10-05 NOTE — Progress Notes (Signed)
   Return patient Visit  Assessment: Gabrielle Clay is a 72 y.o. female with the following: 1. Closed fracture of proximal end of left humerus, subsequent encounter  Plan: Gabrielle Clay fell and sustained a proximal humerus fracture through the surgical neck.  Her pain is improved, as she is no longer taking any of her pain.  She is able to use her arm more.  She is unable to get her hand to her head.  Radiographs remain unchanged, without obvious callus formation.  I think that she will continue to improve, and regain more function.  I do not think that she will regain all of her previous function.  She is okay with this.  She is not interested in surgery.  I urged her to continue using it, but be careful especially when lifting overhead.  She states understanding.  She will call the clinic if she has any further issues.   Follow-up: Return if symptoms worsen or fail to improve.  Subjective:  Chief Complaint  Patient presents with   Fracture    L shoulder DOI 07/26/23    History of Present Illness: Gabrielle Clay is a 72 y.o. female who returns for evaluation of left shoulder pain.  She is right-hand dominant.  She sustained a left proximal humerus fracture approximately 2.5 months ago.  She is no longer using a sling. She is able use her left arm, except for overhead motion.  She feels some motion in the left shoulder.  She is not taking anything for pain currently.  No numbness or tingling.  Review of Systems: No fevers or chills No numbness or tingling No chest pain No shortness of breath No bowel or bladder dysfunction No GI distress No headaches      Objective: There were no vitals taken for this visit.  Physical Exam:  General: Elderly female., Alert and oriented., and No acute distress. Gait: Normal gait.  Left shoulder without obvious deformity.  No gross motion in the proximal humerus.  No bruising.  Sensation is intact in the axillary nerve  distribution.  Sensation intact throughout the left hand.  She tolerates it 100 degrees of passive abduction.  100 degrees of forward flexion.  External rotation or side of 35 to 40 degrees.  Can get her hand to her mouth.  She is unable to get the hand to the top of her head.  IMAGING: I personally ordered and reviewed the following images  X-rays of the left shoulder were obtained in clinic today.  These are compared available x-rays.  Persistent displacement of the surgical neck fracture, with the small amount of callus formation.  No change in overall alignment.  Glenohumeral joint remains reduced.  No new injuries.  No bony lesions.  Impression: Stable left displaced proximal humerus fracture  New Medications:  No orders of the defined types were placed in this encounter.     Gabrielle Barre, MD  10/05/2023 1:28 PM

## 2023-11-15 DIAGNOSIS — E782 Mixed hyperlipidemia: Secondary | ICD-10-CM | POA: Diagnosis not present

## 2023-11-22 ENCOUNTER — Encounter (HOSPITAL_COMMUNITY): Payer: Self-pay | Admitting: Internal Medicine

## 2024-07-05 DIAGNOSIS — E559 Vitamin D deficiency, unspecified: Secondary | ICD-10-CM | POA: Diagnosis not present

## 2024-07-05 DIAGNOSIS — R7309 Other abnormal glucose: Secondary | ICD-10-CM | POA: Diagnosis not present

## 2024-07-05 DIAGNOSIS — D518 Other vitamin B12 deficiency anemias: Secondary | ICD-10-CM | POA: Diagnosis not present

## 2024-07-05 DIAGNOSIS — Z0001 Encounter for general adult medical examination with abnormal findings: Secondary | ICD-10-CM | POA: Diagnosis not present

## 2024-07-05 DIAGNOSIS — E1129 Type 2 diabetes mellitus with other diabetic kidney complication: Secondary | ICD-10-CM | POA: Diagnosis not present

## 2024-07-05 DIAGNOSIS — N183 Chronic kidney disease, stage 3 unspecified: Secondary | ICD-10-CM | POA: Diagnosis not present

## 2024-07-05 DIAGNOSIS — I10 Essential (primary) hypertension: Secondary | ICD-10-CM | POA: Diagnosis not present

## 2024-07-05 DIAGNOSIS — E1165 Type 2 diabetes mellitus with hyperglycemia: Secondary | ICD-10-CM | POA: Diagnosis not present

## 2024-07-05 DIAGNOSIS — E6609 Other obesity due to excess calories: Secondary | ICD-10-CM | POA: Diagnosis not present

## 2024-07-05 DIAGNOSIS — E782 Mixed hyperlipidemia: Secondary | ICD-10-CM | POA: Diagnosis not present

## 2024-07-05 DIAGNOSIS — J302 Other seasonal allergic rhinitis: Secondary | ICD-10-CM | POA: Diagnosis not present

## 2024-07-05 DIAGNOSIS — E1140 Type 2 diabetes mellitus with diabetic neuropathy, unspecified: Secondary | ICD-10-CM | POA: Diagnosis not present

## 2024-07-05 DIAGNOSIS — Z1331 Encounter for screening for depression: Secondary | ICD-10-CM | POA: Diagnosis not present

## 2024-07-05 DIAGNOSIS — Z6830 Body mass index (BMI) 30.0-30.9, adult: Secondary | ICD-10-CM | POA: Diagnosis not present

## 2024-09-10 DIAGNOSIS — E039 Hypothyroidism, unspecified: Secondary | ICD-10-CM | POA: Diagnosis not present

## 2024-10-24 DIAGNOSIS — E1140 Type 2 diabetes mellitus with diabetic neuropathy, unspecified: Secondary | ICD-10-CM | POA: Diagnosis not present

## 2024-10-24 DIAGNOSIS — J069 Acute upper respiratory infection, unspecified: Secondary | ICD-10-CM | POA: Diagnosis not present

## 2024-10-24 DIAGNOSIS — K219 Gastro-esophageal reflux disease without esophagitis: Secondary | ICD-10-CM | POA: Diagnosis not present

## 2024-10-24 DIAGNOSIS — N1832 Chronic kidney disease, stage 3b: Secondary | ICD-10-CM | POA: Diagnosis not present

## 2024-10-24 DIAGNOSIS — E785 Hyperlipidemia, unspecified: Secondary | ICD-10-CM | POA: Diagnosis not present

## 2024-10-24 DIAGNOSIS — E559 Vitamin D deficiency, unspecified: Secondary | ICD-10-CM | POA: Diagnosis not present

## 2024-10-24 DIAGNOSIS — F411 Generalized anxiety disorder: Secondary | ICD-10-CM | POA: Diagnosis not present

## 2024-10-24 DIAGNOSIS — E538 Deficiency of other specified B group vitamins: Secondary | ICD-10-CM | POA: Diagnosis not present

## 2024-10-24 DIAGNOSIS — F17200 Nicotine dependence, unspecified, uncomplicated: Secondary | ICD-10-CM | POA: Diagnosis not present

## 2024-10-24 DIAGNOSIS — I10 Essential (primary) hypertension: Secondary | ICD-10-CM | POA: Diagnosis not present

## 2024-12-12 ENCOUNTER — Emergency Department (HOSPITAL_COMMUNITY)

## 2024-12-12 ENCOUNTER — Other Ambulatory Visit: Payer: Self-pay

## 2024-12-12 ENCOUNTER — Encounter (HOSPITAL_COMMUNITY): Payer: Self-pay

## 2024-12-12 ENCOUNTER — Inpatient Hospital Stay (HOSPITAL_COMMUNITY)
Admission: EM | Admit: 2024-12-12 | Discharge: 2024-12-14 | DRG: 871 | Disposition: A | Discharge status: Home / Self Care | Attending: Family Medicine | Admitting: Family Medicine

## 2024-12-12 DIAGNOSIS — E875 Hyperkalemia: Secondary | ICD-10-CM | POA: Diagnosis present

## 2024-12-12 DIAGNOSIS — A419 Sepsis, unspecified organism: Principal | ICD-10-CM | POA: Diagnosis present

## 2024-12-12 DIAGNOSIS — K219 Gastro-esophageal reflux disease without esophagitis: Secondary | ICD-10-CM | POA: Diagnosis present

## 2024-12-12 DIAGNOSIS — K529 Noninfective gastroenteritis and colitis, unspecified: Secondary | ICD-10-CM | POA: Diagnosis present

## 2024-12-12 DIAGNOSIS — R6520 Severe sepsis without septic shock: Secondary | ICD-10-CM | POA: Diagnosis present

## 2024-12-12 DIAGNOSIS — Z79890 Hormone replacement therapy (postmenopausal): Secondary | ICD-10-CM

## 2024-12-12 DIAGNOSIS — N1832 Chronic kidney disease, stage 3b: Secondary | ICD-10-CM | POA: Diagnosis present

## 2024-12-12 DIAGNOSIS — Z7982 Long term (current) use of aspirin: Secondary | ICD-10-CM | POA: Diagnosis not present

## 2024-12-12 DIAGNOSIS — N179 Acute kidney failure, unspecified: Secondary | ICD-10-CM | POA: Diagnosis present

## 2024-12-12 DIAGNOSIS — Z7984 Long term (current) use of oral hypoglycemic drugs: Secondary | ICD-10-CM

## 2024-12-12 DIAGNOSIS — Z1152 Encounter for screening for COVID-19: Secondary | ICD-10-CM

## 2024-12-12 DIAGNOSIS — Z888 Allergy status to other drugs, medicaments and biological substances status: Secondary | ICD-10-CM

## 2024-12-12 DIAGNOSIS — I129 Hypertensive chronic kidney disease with stage 1 through stage 4 chronic kidney disease, or unspecified chronic kidney disease: Secondary | ICD-10-CM | POA: Diagnosis present

## 2024-12-12 DIAGNOSIS — I10 Essential (primary) hypertension: Secondary | ICD-10-CM | POA: Diagnosis not present

## 2024-12-12 DIAGNOSIS — E1122 Type 2 diabetes mellitus with diabetic chronic kidney disease: Secondary | ICD-10-CM | POA: Diagnosis present

## 2024-12-12 DIAGNOSIS — J9601 Acute respiratory failure with hypoxia: Secondary | ICD-10-CM | POA: Diagnosis present

## 2024-12-12 DIAGNOSIS — E785 Hyperlipidemia, unspecified: Secondary | ICD-10-CM | POA: Diagnosis present

## 2024-12-12 DIAGNOSIS — I4891 Unspecified atrial fibrillation: Secondary | ICD-10-CM | POA: Diagnosis present

## 2024-12-12 DIAGNOSIS — E8729 Other acidosis: Secondary | ICD-10-CM

## 2024-12-12 DIAGNOSIS — D631 Anemia in chronic kidney disease: Secondary | ICD-10-CM | POA: Diagnosis present

## 2024-12-12 DIAGNOSIS — E440 Moderate protein-calorie malnutrition: Secondary | ICD-10-CM | POA: Diagnosis present

## 2024-12-12 DIAGNOSIS — Z882 Allergy status to sulfonamides status: Secondary | ICD-10-CM

## 2024-12-12 DIAGNOSIS — Z823 Family history of stroke: Secondary | ICD-10-CM

## 2024-12-12 DIAGNOSIS — Z6835 Body mass index (BMI) 35.0-35.9, adult: Secondary | ICD-10-CM | POA: Diagnosis not present

## 2024-12-12 DIAGNOSIS — J189 Pneumonia, unspecified organism: Principal | ICD-10-CM | POA: Diagnosis present

## 2024-12-12 DIAGNOSIS — Z803 Family history of malignant neoplasm of breast: Secondary | ICD-10-CM

## 2024-12-12 DIAGNOSIS — E1165 Type 2 diabetes mellitus with hyperglycemia: Secondary | ICD-10-CM | POA: Diagnosis present

## 2024-12-12 DIAGNOSIS — F17210 Nicotine dependence, cigarettes, uncomplicated: Secondary | ICD-10-CM | POA: Diagnosis present

## 2024-12-12 DIAGNOSIS — E872 Acidosis, unspecified: Secondary | ICD-10-CM | POA: Diagnosis present

## 2024-12-12 DIAGNOSIS — R0602 Shortness of breath: Secondary | ICD-10-CM | POA: Diagnosis present

## 2024-12-12 DIAGNOSIS — Z8249 Family history of ischemic heart disease and other diseases of the circulatory system: Secondary | ICD-10-CM

## 2024-12-12 DIAGNOSIS — Z85828 Personal history of other malignant neoplasm of skin: Secondary | ICD-10-CM

## 2024-12-12 DIAGNOSIS — Z79899 Other long term (current) drug therapy: Secondary | ICD-10-CM

## 2024-12-12 DIAGNOSIS — E039 Hypothyroidism, unspecified: Secondary | ICD-10-CM | POA: Diagnosis present

## 2024-12-12 DIAGNOSIS — Z833 Family history of diabetes mellitus: Secondary | ICD-10-CM

## 2024-12-12 DIAGNOSIS — E871 Hypo-osmolality and hyponatremia: Secondary | ICD-10-CM | POA: Diagnosis present

## 2024-12-12 LAB — CBC WITH DIFFERENTIAL/PLATELET
Abs Immature Granulocytes: 2.22 K/uL — ABNORMAL HIGH (ref 0.00–0.07)
Basophils Absolute: 0.1 K/uL (ref 0.0–0.1)
Basophils Relative: 1 %
Eosinophils Absolute: 0.2 K/uL (ref 0.0–0.5)
Eosinophils Relative: 1 %
HCT: 33 % — ABNORMAL LOW (ref 36.0–46.0)
Hemoglobin: 10.6 g/dL — ABNORMAL LOW (ref 12.0–15.0)
Immature Granulocytes: 8 %
Lymphocytes Relative: 5 %
Lymphs Abs: 1.3 K/uL (ref 0.7–4.0)
MCH: 27.5 pg (ref 26.0–34.0)
MCHC: 32.1 g/dL (ref 30.0–36.0)
MCV: 85.5 fL (ref 80.0–100.0)
Monocytes Absolute: 0.7 K/uL (ref 0.1–1.0)
Monocytes Relative: 3 %
Neutro Abs: 23.8 K/uL — ABNORMAL HIGH (ref 1.7–7.7)
Neutrophils Relative %: 82 %
Platelets: 363 K/uL (ref 150–400)
RBC: 3.86 MIL/uL — ABNORMAL LOW (ref 3.87–5.11)
RDW: 16.6 % — ABNORMAL HIGH (ref 11.5–15.5)
WBC: 28.3 K/uL — ABNORMAL HIGH (ref 4.0–10.5)
nRBC: 0.1 % (ref 0.0–0.2)

## 2024-12-12 LAB — COMPREHENSIVE METABOLIC PANEL WITH GFR
ALT: 26 U/L (ref 0–44)
AST: 71 U/L — ABNORMAL HIGH (ref 15–41)
Albumin: 3.2 g/dL — ABNORMAL LOW (ref 3.5–5.0)
Alkaline Phosphatase: 156 U/L — ABNORMAL HIGH (ref 38–126)
Anion gap: 30 — ABNORMAL HIGH (ref 5–15)
BUN: 94 mg/dL — ABNORMAL HIGH (ref 8–23)
CO2: 11 mmol/L — ABNORMAL LOW (ref 22–32)
Calcium: 8.7 mg/dL — ABNORMAL LOW (ref 8.9–10.3)
Chloride: 84 mmol/L — ABNORMAL LOW (ref 98–111)
Creatinine, Ser: 4.14 mg/dL — ABNORMAL HIGH (ref 0.44–1.00)
GFR, Estimated: 11 mL/min — ABNORMAL LOW
Glucose, Bld: 146 mg/dL — ABNORMAL HIGH (ref 70–99)
Potassium: 5.7 mmol/L — ABNORMAL HIGH (ref 3.5–5.1)
Sodium: 125 mmol/L — ABNORMAL LOW (ref 135–145)
Total Bilirubin: 0.5 mg/dL (ref 0.0–1.2)
Total Protein: 7.8 g/dL (ref 6.5–8.1)

## 2024-12-12 LAB — BLOOD GAS, ARTERIAL
Acid-base deficit: 11.4 mmol/L — ABNORMAL HIGH (ref 0.0–2.0)
Bicarbonate: 13.8 mmol/L — ABNORMAL LOW (ref 20.0–28.0)
Drawn by: 41977
O2 Saturation: 93.5 %
Patient temperature: 37.1
pCO2 arterial: 28 mmHg — ABNORMAL LOW (ref 32–48)
pH, Arterial: 7.3 — ABNORMAL LOW (ref 7.35–7.45)
pO2, Arterial: 71 mmHg — ABNORMAL LOW (ref 83–108)

## 2024-12-12 LAB — RESP PANEL BY RT-PCR (RSV, FLU A&B, COVID)  RVPGX2
Influenza A by PCR: NEGATIVE
Influenza B by PCR: NEGATIVE
Resp Syncytial Virus by PCR: NEGATIVE
SARS Coronavirus 2 by RT PCR: NEGATIVE

## 2024-12-12 LAB — MRSA NEXT GEN BY PCR, NASAL: MRSA by PCR Next Gen: NOT DETECTED

## 2024-12-12 LAB — LACTIC ACID, PLASMA: Lactic Acid, Venous: 1 mmol/L (ref 0.5–1.9)

## 2024-12-12 LAB — GLUCOSE, CAPILLARY: Glucose-Capillary: 135 mg/dL — ABNORMAL HIGH (ref 70–99)

## 2024-12-12 LAB — BETA-HYDROXYBUTYRIC ACID: Beta-Hydroxybutyric Acid: 0.75 mmol/L — ABNORMAL HIGH (ref 0.05–0.27)

## 2024-12-12 LAB — MAGNESIUM: Magnesium: 1.8 mg/dL (ref 1.7–2.4)

## 2024-12-12 MED ORDER — SODIUM CHLORIDE 0.9 % IV SOLN
1000.0000 mL | INTRAVENOUS | Status: AC
  Administered 2024-12-12: 1000 mL via INTRAVENOUS

## 2024-12-12 MED ORDER — POLYETHYLENE GLYCOL 3350 17 G PO PACK: 17.0000 g | PACK | Freq: Every day | ORAL | Status: DC | PRN

## 2024-12-12 MED ORDER — SODIUM CHLORIDE 0.9 % IV SOLN
1000.0000 mL | INTRAVENOUS | Status: DC
  Administered 2024-12-12: 1000 mL via INTRAVENOUS

## 2024-12-12 MED ORDER — CHLORHEXIDINE GLUCONATE CLOTH 2 % EX PADS
6.0000 | MEDICATED_PAD | Freq: Every day | CUTANEOUS | Status: DC
  Administered 2024-12-12 – 2024-12-14 (×3): 6 via TOPICAL

## 2024-12-12 MED ORDER — SODIUM CHLORIDE 0.9 % IV SOLN
500.0000 mg | Freq: Once | INTRAVENOUS | Status: AC
  Administered 2024-12-12: 500 mg via INTRAVENOUS
  Filled 2024-12-12: qty 5

## 2024-12-12 MED ORDER — SODIUM CHLORIDE 0.9 % IV SOLN: 500.0000 mg | INTRAVENOUS | Status: DC

## 2024-12-12 MED ORDER — INSULIN ASPART 100 UNIT/ML IJ SOLN
0.0000 [IU] | Freq: Three times a day (TID) | INTRAMUSCULAR | Status: DC
  Administered 2024-12-13 (×2): 1 [IU] via SUBCUTANEOUS
  Administered 2024-12-14: 3 [IU] via SUBCUTANEOUS
  Administered 2024-12-14: 9 [IU] via SUBCUTANEOUS
  Filled 2024-12-12 (×4): qty 1

## 2024-12-12 MED ORDER — SODIUM CHLORIDE 0.9 % IV SOLN: INTRAVENOUS | Status: DC

## 2024-12-12 MED ORDER — SODIUM CHLORIDE 0.9 % IV BOLUS (SEPSIS)
500.0000 mL | Freq: Once | INTRAVENOUS | Status: AC
  Administered 2024-12-12: 500 mL via INTRAVENOUS

## 2024-12-12 MED ORDER — SODIUM CHLORIDE 0.9 % IV SOLN
1.0000 g | Freq: Once | INTRAVENOUS | Status: AC
  Administered 2024-12-12: 1 g via INTRAVENOUS
  Filled 2024-12-12: qty 10

## 2024-12-12 MED ORDER — ACETAMINOPHEN 650 MG RE SUPP: 650.0000 mg | Freq: Four times a day (QID) | RECTAL | Status: DC | PRN

## 2024-12-12 MED ORDER — SODIUM CHLORIDE 0.9 % IV BOLUS
1000.0000 mL | Freq: Once | INTRAVENOUS | Status: AC
  Administered 2024-12-12: 1000 mL via INTRAVENOUS

## 2024-12-12 MED ORDER — HEPARIN SODIUM (PORCINE) 5000 UNIT/ML IJ SOLN
5000.0000 [IU] | Freq: Three times a day (TID) | INTRAMUSCULAR | Status: DC
  Administered 2024-12-12 – 2024-12-14 (×5): 5000 [IU] via SUBCUTANEOUS
  Filled 2024-12-12 (×5): qty 1

## 2024-12-12 MED ORDER — SODIUM CHLORIDE 0.9 % IV SOLN
100.0000 mg | Freq: Two times a day (BID) | INTRAVENOUS | Status: DC
  Filled 2024-12-12 (×2): qty 100

## 2024-12-12 MED ORDER — ENSURE PLUS HIGH PROTEIN PO LIQD
237.0000 mL | Freq: Two times a day (BID) | ORAL | Status: DC
  Administered 2024-12-13: 237 mL via ORAL

## 2024-12-12 MED ORDER — SODIUM CHLORIDE 0.9 % IV SOLN
2.0000 g | INTRAVENOUS | Status: DC
  Administered 2024-12-13: 2 g via INTRAVENOUS
  Filled 2024-12-12: qty 20

## 2024-12-12 MED ORDER — ALBUTEROL SULFATE (2.5 MG/3ML) 0.083% IN NEBU: 2.5000 mg | INHALATION_SOLUTION | Freq: Four times a day (QID) | RESPIRATORY_TRACT | Status: DC | PRN

## 2024-12-12 MED ORDER — METOPROLOL TARTRATE 5 MG/5ML IV SOLN
5.0000 mg | INTRAVENOUS | Status: DC | PRN
  Administered 2024-12-12: 5 mg via INTRAVENOUS
  Filled 2024-12-12: qty 5

## 2024-12-12 MED ORDER — GUAIFENESIN-DM 100-10 MG/5ML PO SYRP
15.0000 mL | ORAL_SOLUTION | Freq: Three times a day (TID) | ORAL | Status: AC
  Administered 2024-12-12 – 2024-12-13 (×3): 15 mL via ORAL
  Filled 2024-12-12 (×3): qty 15

## 2024-12-12 MED ORDER — ONDANSETRON HCL 4 MG PO TABS: 4.0000 mg | ORAL_TABLET | Freq: Four times a day (QID) | ORAL | Status: DC | PRN

## 2024-12-12 MED ORDER — INSULIN ASPART 100 UNIT/ML IJ SOLN
0.0000 [IU] | Freq: Every day | INTRAMUSCULAR | Status: DC
  Administered 2024-12-13: 2 [IU] via SUBCUTANEOUS
  Filled 2024-12-12: qty 1

## 2024-12-12 MED ORDER — ONDANSETRON HCL 4 MG/2ML IJ SOLN: 4.0000 mg | Freq: Four times a day (QID) | INTRAMUSCULAR | Status: DC | PRN

## 2024-12-12 MED ORDER — ACETAMINOPHEN 325 MG PO TABS: 650.0000 mg | ORAL_TABLET | Freq: Four times a day (QID) | ORAL | Status: DC | PRN

## 2024-12-12 NOTE — ED Notes (Signed)
 Patient transported to CT

## 2024-12-12 NOTE — ED Notes (Signed)
Xray in room for CXR

## 2024-12-12 NOTE — ED Triage Notes (Signed)
 Pt had exposure to flu A from son and has had a cough and body aches since last Friday.  Pt reports she had a few episodes of vomiting and diarrhea for the first 2 days but that has resolved.

## 2024-12-12 NOTE — ED Notes (Addendum)
 Pt's sats on RA 87-89% with good pleth, O2 applied [place] at 2 L/M-sats increased to 92%, increased O2 to 3 L/M-sats increased to 94%  EDP made aware as well

## 2024-12-12 NOTE — ED Notes (Signed)
Pt returned CT

## 2024-12-12 NOTE — H&P (Addendum)
 " History and Physical    Gabrielle Clay FMW:[id number] DOB: [birth year] DOA: 12/12/2024  PCP: Marvine Rush, MD   Patient coming from: Home  I have personally briefly reviewed patient's old medical records in [facility]  Chief Complaint: Cough  HPI: Gabrielle Clay is a 74 y.o. female with medical history significant for hypertension.  Patient presented to the ED with complaints of cough, nonproductive, and generalized bodyaches of 5 days duration.  She denies difficulty breathing.  She reports 2 days of vomiting and diarrhea that has improved.  No abdominal pain.  No chest pain.  No lower extremity swelling.  No urinary symptoms.  No history of heart disease.  No problems swallowing.  ED Course: Temperature 97.9.  Heart rate reduced from 96-145 at the time of my evaluation, tachypneic respirate rate 18-33.  Blood pressure systolic 108-158.  O2 sats down to 87% on room air, placed on 5 L.  Sats currently 92% WBC 28.3. Anion gap-30, serum bicarb low at 11. Creatinine elevated 4.14. Sodium low 125. Mildly elevated ALP 156. CT chest without contrast-large area of dense consolidation throughout the posterior lateral inferior left upper lobe with air bronchograms. 500 mL bolus given, normal saline at 125 cc/h started. IV ceftriaxone  and azithromycin  started. Hospitalist admit for pneumonia, hypoxia.  Review of Systems: As per HPI all other systems reviewed and negative.  Past Medical History:  Diagnosis Date   GERD (gastroesophageal reflux disease)    Hypertension    Low back pain    Skin cancer    Thyroid  disease    hypothyroidism    Past Surgical History:  Procedure Laterality Date   BREAST BIOPSY     long time ago-pt doesn't remember- no scar   TUBAL LIGATION       reports that she has been smoking cigarettes. She has a 30 pack-year smoking history. She has never used smokeless tobacco. She reports that she does not drink alcohol and does not use  drugs.  Allergies[1]  Family History  Problem Relation Age of Onset   Hypertension Father    Stroke Mother    Heart block Mother    Diabetes Brother    Hypertension Brother    Breast cancer Son    Breast cancer Paternal Aunt     Prior to Admission medications  Medication Sig Start Date End Date Taking? Authorizing Provider  aspirin 81 MG tablet Take 81 mg by mouth daily.    [provider]  chlorthalidone (HYGROTON) 25 MG tablet Take 25 mg by mouth daily.    [provider]  Flaxseed, Linseed, (FLAX SEED OIL PO) Take by mouth.    [provider]  gemfibrozil (LOPID) 600 MG tablet Take 600 mg by mouth 2 (two) times daily before a meal.    [provider]  glipiZIDE (GLUCOTROL XL) 10 MG 24 hr tablet Take 10 mg by mouth in the morning and at bedtime.    [provider]  levothyroxine (SYNTHROID, LEVOTHROID) 50 MCG tablet Take 50 mcg by mouth daily before breakfast.  03/10/15   [provider]  LOSARTAN POTASSIUM PO Take 100 mg by mouth daily.    [provider]  METOPROLOL  TARTRATE PO Take 25 mg by mouth in the morning and at bedtime.    [provider]  naproxen  (NAPROSYN ) 500 MG tablet Take 1 tablet (500 mg total) by mouth 2 (two) times daily with a meal. 08/10/23   Onesimo Oneil DELENA, MD  Omega-3 Fatty  Acids (FISH OIL) 1000 MG CAPS Take 1,000 mg by mouth 2 (two) times daily.    [provider]  pantoprazole (PROTONIX) 40 MG tablet Take 40 mg by mouth daily.  05/24/15   [provider]    Physical Exam: Vitals:   12/12/24 1824 12/12/24 1825 12/12/24 1830 12/12/24 1900  BP:    (!) 110/39  Pulse: (!) 111 (!) 111 (!) 111 (!) 108  Resp: (!) 27 (!) 28 (!) 29 (!) 27  Temp:      TempSrc:      SpO2: 92% 93% 94% 92%  Weight:        Constitutional: NAD, calm, comfortable Vitals:   12/12/24 1824 12/12/24 1825 12/12/24 1830 12/12/24 1900  BP:    (!) 110/39  Pulse: (!) 111 (!) 111 (!) 111 (!) 108  Resp:  (!) 27 (!) 28 (!) 29 (!) 27  Temp:      TempSrc:      SpO2: 92% 93% 94% 92%  Weight:       Eyes: PERRL, lids and conjunctivae normal ENMT: Mucous membranes are moist.   Neck: normal, supple, no masses, no thyromegaly Respiratory: Congested breath sound, with cough, tachypneic, mild increased work of breathing.  No accessory muscle use.   Cardiovascular: Tachycardic, up to 145 at the time of my evaluation, regular rate and rhythm, no murmurs / rubs / gallops.  Extremities warm.  No extremity edema. Abdomen: no tenderness, no masses palpated. No hepatosplenomegaly. Bowel sounds positive.  Musculoskeletal: no clubbing / cyanosis. No joint deformity upper and lower extremities. Good ROM, no contractures. Normal muscle tone.  Skin: no rashes, lesions, ulcers. No induration Neurologic: No facial asymmetry, speech fluent, moves all extremities spontaneously.SABRA  Psychiatric: Mildly lethargic, able to answer questions, oriented x 3, appropriate mood  Labs on Admission: I have personally reviewed following labs and imaging studies  CBC: Recent Labs  Lab 12/12/24 1323  WBC 28.3*  NEUTROABS 23.8*  HGB 10.6*  HCT 33.0*  MCV 85.5  PLT 363   Basic Metabolic Panel: Recent Labs  Lab 12/12/24 1323  NA 125*  K 5.7*  CL 84*  CO2 11*  GLUCOSE 146*  BUN 94*  CREATININE 4.14*  CALCIUM  8.7*   GFR: CrCl cannot be calculated (Unknown ideal weight.). Liver Function Tests: Recent Labs  Lab 12/12/24 1323  AST 71*  ALT 26  ALKPHOS 156*  BILITOT 0.5  PROT 7.8  ALBUMIN 3.2*   Urine analysis:    Component Value Date/Time   COLORURINE YELLOW 06/02/2015 2054   APPEARANCEUR CLEAR 06/02/2015 2054   LABSPEC 1.015 06/02/2015 2054   PHURINE 6.0 06/02/2015 2054   GLUCOSEU >1000 (A) 06/02/2015 2054   HGBUR TRACE (A) 06/02/2015 2054   BILIRUBINUR NEGATIVE 06/02/2015 2054   KETONESUR NEGATIVE 06/02/2015 2054   PROTEINUR NEGATIVE 06/02/2015 2054   UROBILINOGEN 0.2 06/02/2015 2054   NITRITE  NEGATIVE 06/02/2015 2054   LEUKOCYTESUR NEGATIVE 06/02/2015 2054    Radiological Exams on Admission: CT CHEST WO CONTRAST Result Date: 12/12/2024 EXAM: CT CHEST WITHOUT CONTRAST 12/12/2024 04:37:29 PM TECHNIQUE: CT of the chest was performed without the administration of intravenous contrast. Multiplanar reformatted images are provided for review. Automated exposure control, iterative reconstruction, and/or weight based adjustment of the mA/kV was utilized to reduce the radiation dose to as low as reasonably achievable. COMPARISON: Chest x-ray dated 12/12/2024. CLINICAL HISTORY: Abnormal x-ray - lung opacity/opacities. FINDINGS: MEDIASTINUM: Heart and pericardium are unremarkable. Coronary and aortic atherosclerotic calcifications are noted. The central airways  are clear. There are minimal secretions layering within the trachea. There is a small amount of fluid throughout the esophagus. LYMPH NODES: Difficult to assess for hilar adenopathy secondary to adjacent airspace disease and lack of contrast. There is likely at least 1 enlarged left hilar lymph node measuring 13 mm. No other enlarged mediastinal or hilar lymph nodes are seen. There are some prominent pretracheal and peritracheal lymph nodes. LUNGS AND PLEURA: There is a large area of dense consolidation throughout the posterior, lateral and inferior left upper lobe. Air bronchograms are present. Left upper lobe bronchus is patent. There is minimal dependent atelectasis in the bilateral lower lobes. There is an inferior right upper lobe pulmonary nodule measuring 5 mm on image 4/75. No other pulmonary nodules are seen. There is a trace left pleural effusion. No pneumothorax. 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 No acute fractures are seen. No cortical erosions are seen. There is a small sclerotic focus in the lateral left 7th rib  without bony expansion or osseous destruction. No other focal osseous lesions are identified. UPPER ABDOMEN: Limited images of the upper abdomen demonstrates no acute abnormality. IMPRESSION: 1. Large area of dense consolidation throughout the posterior, lateral, and inferior left upper lobe with air bronchograms, with patent left upper lobe bronchus. 2. Trace left pleural effusion. 3. Likely mildly enlarged left hilar lymph node measuring 13 mm. 4. Inferior right upper lobe 5 mm pulmonary nodule; as per Fleischner Society Guidelines, no routine follow-up is recommended if low risk, and optional non-contrast chest CT at 12 months if high risk. Electronically signed by: Greig Pique MD MD 12/12/2024 04:47 PM EST RP Workstation: [id number]   DG Chest Portable 1 View Result Date: 12/12/2024 CLINICAL DATA:  Cough and flu exposure. EXAM: PORTABLE CHEST 1 VIEW COMPARISON:  None Available. FINDINGS: The heart size and mediastinal contours are within normal limits. A large hazy opacity is seen along the periphery of the mid to upper and mid to lower left lung. Mild left basilar atelectasis and/or infiltrate is also noted. No pneumothorax is identified. A chronic appearing deformity is seen extending through the surgical neck of the proximal left humerus with 1.5 cm distal displacement of the left humeral shaft. IMPRESSION: 1. Large left-sided pulmonary opacity which may represent a large area of airspace disease and/or pleural fluid. Correlation with chest CT is recommended as sequelae associated with an underlying neoplastic process cannot be excluded. 2. Mild left basilar atelectasis and/or infiltrate. 3. Chronic appearing deformity of the proximal left humerus. Electronically Signed   By: Suzen Dials M.D.   On: 12/12/2024 12:39    EKG: Independently reviewed.  EKG- 17.57- read as atrial fibrillation.  P waves mostly present, rhythm slightly irregular.  She is quite tachycardic, will  repeat.  Assessment/Plan Principal Problem:   PNA (pneumonia) Active Problems:   Severe sepsis (HCC)   AKI (acute kidney injury)   Acute hypoxic respiratory failure (HCC)   Hypertension   Assessment and Plan:  Severe sepsis 2/2 pneumonia-presenting with cough, severe sepsis criteria with tachypnea-respiratory rate 18-33, tachycardia-90s to 140s, leukocytosis of 28.3.  Mildly lethargic.  COVID/influenza/RSV negative.  CT chest shows large area of dense consolidation throughout the posterior, lateral and inferior left upper lobe with air bronchograms. -Continue IV ceftriaxone  2 g daily and doxycycline- aeronautical engineer of IV azithromycin ) -MRSA PCR - Repeat EKG 19.33- when heart rate had improved showed sinus tachycardia, P waves present,  rhythm regular, rate 107. - Stat lactic acid - normal 1 - Urine strep and Legionella antigen - Obtain blood cultures, antibiotics already given - 1.5 L bolus given with improvement in heart rate, continue N/s 100cc/hr x 12hrs - Obtain UA and urine cultures -Mucolytics, DuoNebs -Addendum-called that patient's heart rate was sustaining in the 140s in A-fib setting of severe sepsis.  2.5 mg metoprolol  X 1 given, heart rate now in the 90s, blood pressure systolic now in the 90s also.  Monitor closely.  Acute hypoxic respiratory failure-O2 sats down to 87% on room air, placed on 5 L-sats 90%.  Likely secondary to severe pneumonia.   - ABG shows pH of 7.3, pCO2 of 28. - Respiratory therapy consult  AKI with likely baseline CKD, hyperkalemia-in the setting of severe sepsis, vomiting and diarrhea.  Creatinine 4.14, last checked 9 years ago creatinine was 1.6.  Potassium 5.7.  Unknown baseline.  On chlorthalidone and losartan. - Hydrate - Renal ultrasound - Hold chlorthalidone and losartan  Anion gap metabolic acidosis-significantly elevated anion gap of 30, low serum bicarb at 11.  ABG shows slightly low pH at 7.3, pCO2 of 28.  History of diabetes not, not  on insulins.  CBG 146.  Likely secondary to AKI,.  Rule out normoglycemic DKA. - Check beta hydroxybutyric - Hydrate and trend  Gastroenteritis-presenting with diarrhea and vomiting, resolving.  No abdominal pain, abdominal exam benign. - Hydrate  Hyponatremia sodium 125, in the setting of gastroenteritis.  No recent blood work, unknown baseline.  She is on HCTZ. - Hydrate with normal saline  Hypertension-blood pressure soft. - Hold losartan/hydrochlorothiazide - Pending med reconciliation, resume metoprolol   Diabetes mellitus-  - HgbA1c - SSI- S - Hold glipizide   DVT prophylaxis: Lovenox Code Status: Full code Family Communication: None at bedside Disposition Plan: > 2 days Consults called: None Admission status:  Inpatient, stepdown I certify that at the point of admission it is my clinical judgment that the patient will require inpatient hospital care spanning beyond 2 midnights from the point of admission due to high intensity of service, high risk for further deterioration and high frequency of surveillance required.   CRITICAL CARE Performed by: Tully FORBES Carwin   Total critical care time: 70 minutes  Critical care time was exclusive of separately billable procedures and treating other patients.  Critical care was necessary to treat or prevent imminent or life-threatening deterioration.  Critical care was time spent personally by me on the following activities: development of treatment plan with patient and/or surrogate as well as nursing, discussions with consultants, evaluation of patient's response to treatment, examination of patient, obtaining history from patient or surrogate, ordering and performing treatments and interventions, ordering and review of laboratory studies, ordering and review of radiographic studies, pulse oximetry and re-evaluation of patient's condition.    Author: Tully FORBES Carwin, MD 12/12/2024 7:20 PM  For on call review  [email or web address].      [1]  Allergies Allergen Reactions   Amlodipine Swelling    Swelling of feet   Sulfa Antibiotics Nausea Only   "

## 2024-12-12 NOTE — ED Provider Notes (Signed)
 " [place] EMERGENCY DEPARTMENT AT [facility] Provider Note   CSN: [id number] Arrival date & time: 12/12/24  1153     Patient presents with: flu like symptoms   Gabrielle Clay is a 74 y.o. female.   Pt complains of shortness of breath.  Patient complains of fever and bodyaches.  Patient reports she began feeling sick on Friday.  Patient has been exposed to someone with influenza.  Patient denies any nausea or vomiting.  Patient has some soreness in her chest.  Patient reports eating and drinking normally.  Patient has a past medical history of hyperlipidemia hypertension she has had a leukocytosis in the past  The history is provided by the patient. No language interpreter was used.       Prior to Admission medications  Medication Sig Start Date End Date Taking? Authorizing Provider  aspirin  81 MG tablet Take 81 mg by mouth daily.    [provider]  chlorthalidone  (HYGROTON ) 25 MG tablet Take 25 mg by mouth daily.    [provider]  Flaxseed, Linseed, (FLAX SEED OIL PO) Take by mouth.    [provider]  gemfibrozil (LOPID) 600 MG tablet Take 600 mg by mouth 2 (two) times daily before a meal.    [provider]  glipiZIDE (GLUCOTROL XL) 10 MG 24 hr tablet Take 10 mg by mouth in the morning and at bedtime.    [provider]  levothyroxine  (SYNTHROID , LEVOTHROID) 50 MCG tablet Take 50 mcg by mouth daily before breakfast.  03/10/15   [provider]  LOSARTAN  POTASSIUM PO Take 100 mg by mouth daily.    [provider]  METOPROLOL  TARTRATE PO Take 25 mg by mouth in the morning and at bedtime.    [provider]  naproxen  (NAPROSYN ) 500 MG tablet Take 1 tablet (500 mg total) by mouth 2 (two) times daily with a meal. 08/10/23   Onesimo Oneil DELENA, MD  Omega-3 Fatty Acids (FISH OIL) 1000 MG CAPS Take 1,000 mg by mouth 2 (two) times daily.    [provider]  pantoprazole  (PROTONIX ) 40 MG tablet Take 40  mg by mouth daily.  05/24/15   [provider]    Allergies: Amlodipine and Sulfa antibiotics    Review of Systems  All other systems reviewed and are negative.   Updated Vital Signs BP (!) 117/50   Pulse (!) 103   Temp 97.9 F (36.6 C) (Oral)   Resp (!) 22   Wt 80.3 kg   SpO2 93%   BMI 35.76 kg/m   Physical Exam Vitals and nursing note reviewed.  Constitutional:      Appearance: She is well-developed.  HENT:     Head: Normocephalic.     Right Ear: Tympanic membrane normal.     Left Ear: Tympanic membrane normal.     Nose: Nose normal.     Mouth/Throat:     Mouth: Mucous membranes are moist.  Eyes:     Pupils: Pupils are equal, round, and reactive to light.  Cardiovascular:     Rate and Rhythm: Normal rate.  Pulmonary:     Effort: Pulmonary effort is normal.  Abdominal:     General: There is no distension.  Musculoskeletal:        General: Normal range of motion.     Cervical back: Normal range of motion.  Skin:    General: Skin is warm.  Neurological:     General: No focal deficit  present.     Mental Status: She is alert and oriented to person, place, and time.  Psychiatric:        Mood and Affect: Mood normal.     (all labs ordered are listed, but only abnormal results are displayed) Labs Reviewed  CBC WITH DIFFERENTIAL/PLATELET - Abnormal; Notable for the following components:      Result Value   WBC 28.3 (*)    RBC 3.86 (*)    Hemoglobin 10.6 (*)    HCT 33.0 (*)    RDW 16.6 (*)    Neutro Abs 23.8 (*)    Abs Immature Granulocytes 2.22 (*)    All other components within normal limits  COMPREHENSIVE METABOLIC PANEL WITH GFR - Abnormal; Notable for the following components:   Sodium 125 (*)    Potassium 5.7 (*)    Chloride 84 (*)    CO2 11 (*)    Glucose, Bld 146 (*)    BUN 94 (*)    Creatinine, Ser 4.14 (*)    Calcium  8.7 (*)    Albumin 3.2 (*)    AST 71 (*)    Alkaline Phosphatase 156 (*)    GFR, Estimated 11 (*)    Anion gap 30  (*)    All other components within normal limits  RESP PANEL BY RT-PCR (RSV, FLU A&B, COVID)  [id number]    EKG: EKG Interpretation Date/Time:  Wednesday December 12 2024 13:49:43 EST Ventricular Rate:  104 PR Interval:  144 QRS Duration:  64 QT Interval:  340 QTC Calculation: 447 R Axis:   8  Text Interpretation: Sinus tachycardia Anterior infarct , age undetermined Abnormal ECG No previous ECGs available Confirmed by Charlyn Sora ([id number]) on 12/12/2024 2:33:11 PM  Radiology: CT CHEST WO CONTRAST Result Date: 12/12/2024 EXAM: CT CHEST WITHOUT CONTRAST 12/12/2024 04:37:29 PM TECHNIQUE: CT of the chest was performed without the administration of intravenous contrast. Multiplanar reformatted images are provided for review. Automated exposure control, iterative reconstruction, and/or weight based adjustment of the mA/kV was utilized to reduce the radiation dose to as low as reasonably achievable. COMPARISON: Chest x-ray dated 12/12/2024. CLINICAL HISTORY: Abnormal x-ray - lung opacity/opacities. FINDINGS: MEDIASTINUM: Heart and pericardium are unremarkable. Coronary and aortic atherosclerotic calcifications are noted. The central airways are clear. There are minimal secretions layering within the trachea. There is a small amount of fluid throughout the esophagus. LYMPH NODES: Difficult to assess for hilar adenopathy secondary to adjacent airspace disease and lack of contrast. There is likely at least 1 enlarged left hilar lymph node measuring 13 mm. No other enlarged mediastinal or hilar lymph nodes are seen. There are some prominent pretracheal and peritracheal lymph nodes. LUNGS AND PLEURA: There is a large area of dense consolidation throughout the posterior, lateral and inferior left upper lobe. Air bronchograms are present. Left upper lobe bronchus is patent. There is minimal dependent atelectasis in the bilateral lower lobes. There is an inferior right upper lobe pulmonary nodule measuring 5 mm on  image 4/75. No other pulmonary nodules are seen. There is a trace left pleural effusion. No pneumothorax. 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 No acute fractures are seen. No cortical erosions are seen. There is a small sclerotic focus in the lateral left 7th rib without bony expansion or osseous destruction. No other focal osseous lesions are identified. UPPER ABDOMEN: Limited images of the upper abdomen demonstrates no acute abnormality. IMPRESSION: 1.  Large area of dense consolidation throughout the posterior, lateral, and inferior left upper lobe with air bronchograms, with patent left upper lobe bronchus. 2. Trace left pleural effusion. 3. Likely mildly enlarged left hilar lymph node measuring 13 mm. 4. Inferior right upper lobe 5 mm pulmonary nodule; as per Fleischner Society Guidelines, no routine follow-up is recommended if low risk, and optional non-contrast chest CT at 12 months if high risk. Electronically signed by: Greig Pique MD MD 12/12/2024 04:47 PM EST RP Workstation: [id number]   DG Chest Portable 1 View Result Date: 12/12/2024 CLINICAL DATA:  Cough and flu exposure. EXAM: PORTABLE CHEST 1 VIEW COMPARISON:  None Available. FINDINGS: The heart size and mediastinal contours are within normal limits. A large hazy opacity is seen along the periphery of the mid to upper and mid to lower left lung. Mild left basilar atelectasis and/or infiltrate is also noted. No pneumothorax is identified. A chronic appearing deformity is seen extending through the surgical neck of the proximal left humerus with 1.5 cm distal displacement of the left humeral shaft. IMPRESSION: 1. Large left-sided pulmonary opacity which may represent a large area of airspace disease and/or pleural fluid. Correlation with chest CT is recommended as sequelae associated with an underlying neoplastic process cannot  be excluded. 2. Mild left basilar atelectasis and/or infiltrate. 3. Chronic appearing deformity of the proximal left humerus. Electronically Signed   By: Suzen Dials M.D.   On: 12/12/2024 12:39     Procedures   Medications Ordered in the ED  sodium chloride  0.9 % bolus 500 mL (0 mLs Intravenous Stopped 12/12/24 1357)    Followed by  0.9 %  sodium chloride  infusion ( Intravenous Restarted 12/12/24 1613)  cefTRIAXone  (ROCEPHIN ) 1 g in sodium chloride  0.9 % 100 mL IVPB (0 g Intravenous Stopped 12/12/24 1523)  azithromycin  (ZITHROMAX ) 500 mg in sodium chloride  0.9 % 250 mL IVPB ( Intravenous Infusion Verify 12/12/24 1612)                                    Medical Decision Making Patient complains of a cough and congestion.  Patient reports she has been sick feeling sick since Friday.  Patient is a smoker.  Amount and/or Complexity of Data Reviewed Labs: ordered. Decision-making details documented in ED Course.    Details: Labs ordered reviewed and interpreted.  Patient has an elevated white blood cell count of 28.3 her hemoglobin is 10.6.  Patient's chemistry shows a sodium of 125 potassium of 5.7 glucose is 146.  Patient's BUN is 94 creatinine is 4.14 GFR is 11. Radiology: ordered and independent interpretation performed. Decision-making details documented in ED Course.    Details: Chest x-ray shows a large left-sided pulmonary opacity radiologist recommended CT scan CT scan shows a large area of hypodensity in the posterior lateral and inferior left upper lobe with air broncho grahams.  Discussion of management or test interpretation with external provider(s): Hospitalist consulted for admission.    Risk Prescription drug management. Risk Details: Patient is given IV fluids x 1 L.  Rocephin  and Zithromax  are started.        Final diagnoses:  Pneumonia of left upper lobe due to infectious organism    ED Discharge Orders     None          Fl[place] Sonny MARLA DEVONNA 12/12/24  1737    Charlyn Sora, MD 12/13/24 1441  "

## 2024-12-12 NOTE — ED Notes (Signed)
 Dr. Pearlean in room with pt, noted pt's HR and rhythm changed, appears pt in Afib-EKG repeated and handed to Dr. Pearlean.  Oral temperature obtained and was 98.1 oral

## 2024-12-13 ENCOUNTER — Inpatient Hospital Stay (HOSPITAL_COMMUNITY)

## 2024-12-13 DIAGNOSIS — E440 Moderate protein-calorie malnutrition: Secondary | ICD-10-CM | POA: Insufficient documentation

## 2024-12-13 DIAGNOSIS — I10 Essential (primary) hypertension: Secondary | ICD-10-CM | POA: Diagnosis not present

## 2024-12-13 DIAGNOSIS — N179 Acute kidney failure, unspecified: Secondary | ICD-10-CM | POA: Diagnosis not present

## 2024-12-13 DIAGNOSIS — J189 Pneumonia, unspecified organism: Secondary | ICD-10-CM | POA: Diagnosis not present

## 2024-12-13 DIAGNOSIS — J9601 Acute respiratory failure with hypoxia: Secondary | ICD-10-CM | POA: Diagnosis not present

## 2024-12-13 LAB — BASIC METABOLIC PANEL WITH GFR
Anion gap: 23 — ABNORMAL HIGH (ref 5–15)
BUN: 95 mg/dL — ABNORMAL HIGH (ref 8–23)
CO2: 16 mmol/L — ABNORMAL LOW (ref 22–32)
Calcium: 7.9 mg/dL — ABNORMAL LOW (ref 8.9–10.3)
Chloride: 93 mmol/L — ABNORMAL LOW (ref 98–111)
Creatinine, Ser: 3.89 mg/dL — ABNORMAL HIGH (ref 0.44–1.00)
GFR, Estimated: 12 mL/min — ABNORMAL LOW
Glucose, Bld: 96 mg/dL (ref 70–99)
Potassium: 3.8 mmol/L (ref 3.5–5.1)
Sodium: 131 mmol/L — ABNORMAL LOW (ref 135–145)

## 2024-12-13 LAB — URINALYSIS, ROUTINE W REFLEX MICROSCOPIC
Bilirubin Urine: NEGATIVE
Glucose, UA: NEGATIVE mg/dL
Ketones, ur: NEGATIVE mg/dL
Nitrite: NEGATIVE
Protein, ur: 30 mg/dL — AB
Specific Gravity, Urine: 1.014 (ref 1.005–1.030)
pH: 5 (ref 5.0–8.0)

## 2024-12-13 LAB — CBC
HCT: 29.5 % — ABNORMAL LOW (ref 36.0–46.0)
Hemoglobin: 9.5 g/dL — ABNORMAL LOW (ref 12.0–15.0)
MCH: 27.5 pg (ref 26.0–34.0)
MCHC: 32.2 g/dL (ref 30.0–36.0)
MCV: 85.5 fL (ref 80.0–100.0)
Platelets: 385 K/uL (ref 150–400)
RBC: 3.45 MIL/uL — ABNORMAL LOW (ref 3.87–5.11)
RDW: 16.7 % — ABNORMAL HIGH (ref 11.5–15.5)
WBC: 28.9 K/uL — ABNORMAL HIGH (ref 4.0–10.5)
nRBC: 0 % (ref 0.0–0.2)

## 2024-12-13 LAB — GLUCOSE, CAPILLARY
Glucose-Capillary: 108 mg/dL — ABNORMAL HIGH (ref 70–99)
Glucose-Capillary: 141 mg/dL — ABNORMAL HIGH (ref 70–99)
Glucose-Capillary: 249 mg/dL — ABNORMAL HIGH (ref 70–99)
Glucose-Capillary: 236 mg/dL — ABNORMAL HIGH (ref 70–99)

## 2024-12-13 LAB — HEMOGLOBIN A1C
Hgb A1c MFr Bld: 7.5 % — ABNORMAL HIGH (ref 4.8–5.6)
Mean Plasma Glucose: 168.55 mg/dL

## 2024-12-13 LAB — STREP PNEUMONIAE URINARY ANTIGEN: Strep Pneumo Urinary Antigen: POSITIVE — AB

## 2024-12-13 MED ORDER — METOPROLOL TARTRATE 25 MG PO TABS
25.0000 mg | ORAL_TABLET | Freq: Two times a day (BID) | ORAL | Status: DC
  Administered 2024-12-13 – 2024-12-14 (×2): 25 mg via ORAL
  Filled 2024-12-13 (×2): qty 1

## 2024-12-13 MED ORDER — METHYLPREDNISOLONE SODIUM SUCC 40 MG IJ SOLR
40.0000 mg | Freq: Two times a day (BID) | INTRAMUSCULAR | Status: DC
  Administered 2024-12-13 – 2024-12-14 (×2): 40 mg via INTRAVENOUS
  Filled 2024-12-13 (×2): qty 1

## 2024-12-13 MED ORDER — LOSARTAN POTASSIUM 50 MG PO TABS
100.0000 mg | ORAL_TABLET | Freq: Every day | ORAL | Status: DC
  Administered 2024-12-14: 100 mg via ORAL
  Filled 2024-12-13: qty 2

## 2024-12-13 MED ORDER — AZITHROMYCIN 250 MG PO TABS
500.0000 mg | ORAL_TABLET | Freq: Every day | ORAL | Status: DC
  Administered 2024-12-13 – 2024-12-14 (×2): 500 mg via ORAL
  Filled 2024-12-13 (×2): qty 2

## 2024-12-13 MED ORDER — ALBUTEROL SULFATE (2.5 MG/3ML) 0.083% IN NEBU: 2.5000 mg | INHALATION_SOLUTION | RESPIRATORY_TRACT | Status: DC | PRN

## 2024-12-13 MED ORDER — AZITHROMYCIN 250 MG PO TABS: 500.0000 mg | ORAL_TABLET | Freq: Every day | ORAL | Status: DC

## 2024-12-13 MED ORDER — ADULT MULTIVITAMIN W/MINERALS CH
1.0000 | ORAL_TABLET | Freq: Every day | ORAL | Status: DC
  Administered 2024-12-13 – 2024-12-14 (×2): 1 via ORAL
  Filled 2024-12-13 (×2): qty 1

## 2024-12-13 MED ORDER — HYDRALAZINE HCL 20 MG/ML IJ SOLN: 10.0000 mg | Freq: Four times a day (QID) | INTRAMUSCULAR | Status: DC | PRN

## 2024-12-13 MED ORDER — SODIUM CHLORIDE 0.9 % IV SOLN: 500.0000 mg | INTRAVENOUS | Status: DC

## 2024-12-13 MED ORDER — IPRATROPIUM-ALBUTEROL 0.5-2.5 (3) MG/3ML IN SOLN
3.0000 mL | Freq: Four times a day (QID) | RESPIRATORY_TRACT | Status: DC
  Administered 2024-12-13 – 2024-12-14 (×6): 3 mL via RESPIRATORY_TRACT
  Filled 2024-12-13 (×5): qty 3

## 2024-12-13 MED ORDER — LEVOTHYROXINE SODIUM 25 MCG PO TABS
50.0000 ug | ORAL_TABLET | Freq: Every day | ORAL | Status: DC
  Administered 2024-12-14: 50 ug via ORAL
  Filled 2024-12-13: qty 2

## 2024-12-13 MED ORDER — PANTOPRAZOLE SODIUM 40 MG PO TBEC
40.0000 mg | DELAYED_RELEASE_TABLET | Freq: Every day | ORAL | Status: DC
  Administered 2024-12-13 – 2024-12-14 (×2): 40 mg via ORAL
  Filled 2024-12-13 (×2): qty 1

## 2024-12-13 MED ORDER — SODIUM CHLORIDE 0.9 % IV SOLN: INTRAVENOUS | Status: AC

## 2024-12-13 NOTE — Progress Notes (Signed)
 Initial Nutrition Assessment  DOCUMENTATION CODES:  Non-severe (moderate) malnutrition in context of acute illness/injury  INTERVENTION:  Ensure Plus High Protein po BID, each supplement provides 350 kcal and 20 grams of protein Food preferences entered in Health Touch MVI with minerals daily Liberalize diet to regular for more menu options to help improve intake  NUTRITION DIAGNOSIS:  Moderate Malnutrition related to acute illness (PNA) as evidenced by energy intake < 75% for > 7 days, mild muscle depletion.  GOAL:  Patient will meet greater than or equal to 90% of their needs  MONITOR:  PO intake, Supplement acceptance  REASON FOR ASSESSMENT:  Rounds, Malnutrition Screening Tool    ASSESSMENT:  74 yo female admitted with severe sepsis r/t PNA. PMH includes HTN, hypothyroidism, GERD, low back pain, skin cancer, smoker.  Patient reports that she has not eaten much of anything except water since last Friday. She typically drinks a Boost for breakfast, steak and cheese sub from [employer] for lunch, and a homemade sandwich for dinner (tomato & cheese or bologna). She does not like chicken, RD to add to preference list in Health Touch. Ensure supplement at bedside, patient thinks she can drink 2 per day. Encouraged small, frequent meals and snacks, including PO supplements to help regain her strength.   Patient meets criteria for moderate malnutrition, given mild depletion of muscle mass and intake meeting < 75% of estimated energy requirement for > 7 days.  Admit weight: 80.3 kg  Average Meal Intake: Heart healthy carb modified diet-12/12/24: 45% intake x 1 recorded meal  Nutritionally Relevant Medications: Scheduled Meds:  azithromycin   500 mg Oral Daily   Chlorhexidine  Gluconate Cloth  6 each Topical Daily   feeding supplement  237 mL Oral BID BM   guaiFENesin -dextromethorphan   15 mL Oral Q8H   heparin   5,000 Units Subcutaneous Q8H   insulin  aspart  0-5 Units  Subcutaneous QHS   insulin  aspart  0-9 Units Subcutaneous TID WC   ipratropium-albuterol   3 mL Nebulization Q6H   multivitamin with minerals  1 tablet Oral Daily   Continuous Infusions:  cefTRIAXone  (ROCEPHIN )  IV     PRN Meds:.acetaminophen  **OR** acetaminophen , albuterol , ondansetron  **OR** ondansetron  (ZOFRAN ) IV, polyethylene glycol  Labs Reviewed: Na 131 CBG ranges from 108-135 mg/dL over the last 24 hours HgbA1c 7.5  NUTRITION - FOCUSED PHYSICAL EXAM: Flowsheet Row Most Recent Value  Orbital Region No depletion  Upper Arm Region No depletion  Thoracic and Lumbar Region No depletion  Buccal Region Mild depletion  Temple Region Mild depletion  Clavicle Bone Region Mild depletion  Clavicle and Acromion Bone Region No depletion  Scapular Bone Region No depletion  Dorsal Hand Mild depletion  Patellar Region Unable to assess  Anterior Thigh Region Unable to assess  Posterior Calf Region Mild depletion  Edema (RD Assessment) Mild  Hair Reviewed  Eyes Reviewed  Mouth Reviewed  Skin Reviewed  Nails Reviewed    Diet Order:   Diet Order             Diet regular Room service appropriate? Yes; Fluid consistency: Thin  Diet effective now                   EDUCATION NEEDS:  Education needs have been addressed  Skin:  Skin Assessment: Reviewed RN Assessment  Last BM:  1/8 type 4  Height:  Ht Readings from Last 1 Encounters:  12/13/24 4' 11 (1.499 m)   Weight:  Wt Readings from Last 1 Encounters:  12/12/24 80.3 kg   Ideal Body Weight:  44.7 kg  BMI:  Body mass index is 35.76 kg/m.  Estimated Nutritional Needs:  Kcal:  1500-1700 Protein:  85-95 gm Fluid:  1.6-1.8 L   Suzen HUNT RD, LDN, CNSC Contact via secure chat. If unavailable, use group chat RD Inpatient.

## 2024-12-13 NOTE — Progress Notes (Signed)
 " PROGRESS NOTE  Gabrielle Clay, is a 74 y.o. female, DOB - 1950-12-27, FMW:[id number]  Admit date - 12/12/2024   Admitting Physician Ejiroghene FORBES Carwin, MD  Outpatient Primary MD for the patient is Marvine Rush, MD  LOS - 1  Chief Complaint  Patient presents with   flu like symptoms      Brief Narrative:  74 y.o. female with medical history significant for  hypothyroidism, HTN, GERD and HLD admitted on 12/12/2020 with severe sepsis and acute hypoxic respiratory failure due to pneumonia    -Assessment and Plan: 1)Severe sepsis due to Pneumonia---POA -COVID/influenza/RSV negative.  CT chest shows large area of dense consolidation throughout the posterior, lateral and inferior left upper lobe with air bronchograms. -MRSA PCR Neg - - Urine strep and Legionella antigen pending =-IV Rocephin  and azithromycin  pending culture data WBC 28.3 >>28.9 -Add IV Solu-Medrol  -Continue mucolytics and bronchodilators  2)Acute hypoxic respiratory failure- - Due to #1 above -Manage as above #1 -Currently requiring 6 L of oxygen via nasal cannula  3)AKI----acute kidney injury on CKD stage -3B  -creatinine on admission= 4.14  ,  baseline creatinine = no recent baseline available     ,  -creatinine is now= 3.89  , -- renally adjust medications, avoid nephrotoxic agents / dehydration  / hypotension --Renal ultrasound without obstructive uropathy - Continue to hold losartan  and chlorthalidone  - 4)Acute on chronic anemia--suspect baseline chronic anemia of CKD -Hgb drifting down due to hemodilution from IV fluids for sepsis protocol -No bleeding concerns Hgb currently greater than 9  5) acute hyponatremia--- sodium improving with hydration = Continue to hold chlorthalidone    6)Gastroenteritis-presenting with diarrhea and vomiting, resolving.  -- -Anion gap metabolic acidosis improving - Continue IV fluids  7)DM2-A1c 7.5 reflecting uncontrolled DM with hyperglycemia PTA -Hold PTA  glipizide Use Novolog /Humalog Sliding scale insulin  with Accu-Cheks/Fingersticks as ordered   8)HTN--continue losartan  and metoprolol , avoid chlorthalidone  -IV hydralazine  as needed elevated BP - Status is: Inpatient   Disposition: The patient is from: Home              Anticipated d/c is to: Home              Anticipated d/c date is: 3 days              Patient currently is not medically stable to d/c. Barriers: Not Clinically Stable-   Code Status :  -  Code Status: Full Code   Family Communication:  NA (patient is alert, awake and coherent)  DVT Prophylaxis  :   - SCDs heparin  injection 5,000 Units Start: 12/12/24 2200   Lab Results  Component Value Date   PLT 385 12/13/2024   Inpatient Medications  Scheduled Meds:  azithromycin   500 mg Oral Daily   Chlorhexidine  Gluconate Cloth  6 each Topical Daily   feeding supplement  237 mL Oral BID BM   heparin   5,000 Units Subcutaneous Q8H   insulin  aspart  0-5 Units Subcutaneous QHS   insulin  aspart  0-9 Units Subcutaneous TID WC   ipratropium-albuterol   3 mL Nebulization Q6H   multivitamin with minerals  1 tablet Oral Daily   Continuous Infusions:  cefTRIAXone  (ROCEPHIN )  IV 2 g (12/13/24 1401)   PRN Meds:.acetaminophen  **OR** acetaminophen , albuterol , ondansetron  **OR** ondansetron  (ZOFRAN ) IV, polyethylene glycol   Anti-infectives (From admission, onward)    Start     Dose/Rate Route Frequency Ordered Stop   12/13/24 1500  doxycycline  (VIBRAMYCIN ) 100 mg in sodium chloride  0.9 % 250  mL IVPB  Status:  Discontinued        100 mg 125 mL/hr over 120 Minutes Intravenous Every 12 hours 12/12/24 2005 12/13/24 0825   12/13/24 1400  cefTRIAXone  (ROCEPHIN ) 2 g in sodium chloride  0.9 % 100 mL IVPB        2 g 200 mL/hr over 30 Minutes Intravenous Every 24 hours 12/12/24 2000 12/18/24 1359   12/13/24 1400  azithromycin  (ZITHROMAX ) 500 mg in sodium chloride  0.9 % 250 mL IVPB  Status:  Discontinued        500 mg 250 mL/hr over 60  Minutes Intravenous Every 24 hours 12/12/24 2000 12/12/24 2005   12/13/24 0915  azithromycin  (ZITHROMAX ) 500 mg in sodium chloride  0.9 % 250 mL IVPB  Status:  Discontinued        500 mg 250 mL/hr over 60 Minutes Intravenous Every 24 hours 12/13/24 0825 12/13/24 0826   12/13/24 0915  azithromycin  (ZITHROMAX ) tablet 500 mg  Status:  Discontinued        500 mg Oral Daily 12/13/24 0826 12/13/24 0827   12/13/24 0915  azithromycin  (ZITHROMAX ) tablet 500 mg        500 mg Oral Daily 12/13/24 0827 12/18/24 0959   12/12/24 2000  cefTRIAXone  (ROCEPHIN ) 1 g in sodium chloride  0.9 % 100 mL IVPB       Note to Pharmacy: Totaling 2 g for treatment of pneumonia.   1 g 200 mL/hr over 30 Minutes Intravenous  Once 12/12/24 1938 12/12/24 2114   12/12/24 1445  cefTRIAXone  (ROCEPHIN ) 1 g in sodium chloride  0.9 % 100 mL IVPB        1 g 200 mL/hr over 30 Minutes Intravenous  Once 12/12/24 1433 12/12/24 1523   12/12/24 1445  azithromycin  (ZITHROMAX ) 500 mg in sodium chloride  0.9 % 250 mL IVPB        500 mg 250 mL/hr over 60 Minutes Intravenous  Once 12/12/24 1433 12/12/24 1626         Subjective: Gabrielle Clay today has no fevers, no emesis,  No chest pain,   - Cough and hypoxia persist -Dyspnea persist   Objective: Vitals:   12/13/24 1200 12/13/24 1253 12/13/24 1300 12/13/24 1500  BP: (!) 149/48  (!) 159/44 (!) 145/77  Pulse: 95  94 94  Resp: (!) 21  (!) 23 (!) 21  Temp:   98 F (36.7 C)   TempSrc:   Axillary   SpO2: 97% 93% 96% 98%  Weight:      Height:        Intake/Output Summary (Last 24 hours) at 12/13/2024 1604 Last data filed at 12/13/2024 0834 Gross per 24 hour  Intake 399.19 ml  Output 100 ml  Net 299.19 ml   Filed Weights   12/12/24 1210  Weight: 80.3 kg   Physical Exam Gen:- Awake Alert, dyspnea on minimal exertion HEENT:- [place].AT, No sclera icterus Nose- [place] 6 L/min  Neck-Supple Neck,No JVD,.  Lungs-diminished breath sounds, scattered rhonchi  CV- S1, S2 normal, regular   Abd-  +ve B.Sounds, Abd Soft, No tenderness,    Extremity/Skin:- No  edema, pedal pulses present  Psych-affect is appropriate, oriented x3 Neuro-no new focal deficits, no tremors  Data Reviewed: I have personally reviewed following labs and imaging studies  CBC: Recent Labs  Lab 12/12/24 1323 12/13/24 0439  WBC 28.3* 28.9*  NEUTROABS 23.8*  --   HGB 10.6* 9.5*  HCT 33.0* 29.5*  MCV 85.5 85.5  PLT 363 385   Basic Metabolic Panel:  Recent Labs  Lab 12/12/24 1323 12/12/24 1853 12/13/24 0439  NA 125*  --  131*  K 5.7*  --  3.8  CL 84*  --  93*  CO2 11*  --  16*  GLUCOSE 146*  --  96  BUN 94*  --  95*  CREATININE 4.14*  --  3.89*  CALCIUM  8.7*  --  7.9*  MG  --  1.8  --    GFR: Estimated Creatinine Clearance: 11.8 mL/min (A) (by C-G formula based on SCr of 3.89 mg/dL (H)). Liver Function Tests: Recent Labs  Lab 12/12/24 1323  AST 71*  ALT 26  ALKPHOS 156*  BILITOT 0.5  PROT 7.8  ALBUMIN 3.2*   HbA1C: Recent Labs    12/12/24 1853  HGBA1C 7.5*   Recent Results (from the past 240 hours)  Resp panel by RT-PCR (RSV, Flu A&B, Covid) Anterior Nasal Swab     Status: None   Collection Time: 12/12/24 12:48 PM   Specimen: Anterior Nasal Swab  Result Value Ref Range Status   SARS Coronavirus 2 by RT PCR NEGATIVE NEGATIVE Final    Comment: (NOTE) SARS-CoV-2 target nucleic acids are NOT DETECTED.  The SARS-CoV-2 RNA is generally detectable in upper respiratory specimens during the acute phase of infection. The lowest concentration of SARS-CoV-2 viral copies this assay can detect is 138 copies/mL. A negative result does not preclude SARS-Cov-2 infection and should not be used as the sole basis for treatment or other patient management decisions. A negative result may occur with  improper specimen collection/handling, submission of specimen other than nasopharyngeal swab, presence of viral mutation(s) within the areas targeted by this assay, and inadequate number of  viral copies(<138 copies/mL). A negative result must be combined with clinical observations, patient history, and epidemiological information. The expected result is Negative.  Fact Sheet for Patients:  bloggercourse.com  Fact Sheet for Healthcare Providers:  seriousbroker.it  This test is no t yet approved or cleared by the [place]  FDA and  has been authorized for detection and/or diagnosis of SARS-CoV-2 by FDA under an Emergency Use Authorization (EUA). This EUA will remain  in effect (meaning this test can be used) for the duration of the COVID-19 declaration under Section 564(b)(1) of the Act, 21 U.S.C.section 360bbb-3(b)(1), unless the authorization is terminated  or revoked sooner.       Influenza A by PCR NEGATIVE NEGATIVE Final   Influenza B by PCR NEGATIVE NEGATIVE Final    Comment: (NOTE) The Xpert Xpress SARS-CoV-2/FLU/RSV plus assay is intended as an aid in the diagnosis of influenza from Nasopharyngeal swab specimens and should not be used as a sole basis for treatment. Nasal washings and aspirates are unacceptable for Xpert Xpress SARS-CoV-2/FLU/RSV testing.  Fact Sheet for Patients: bloggercourse.com  Fact Sheet for Healthcare Providers: seriousbroker.it  This test is not yet approved or cleared by the [place]  FDA and has been authorized for detection and/or diagnosis of SARS-CoV-2 by FDA under an Emergency Use Authorization (EUA). This EUA will remain in effect (meaning this test can be used) for the duration of the COVID-19 declaration under Section 564(b)(1) of the Act, 21 U.S.C. section 360bbb-3(b)(1), unless the authorization is terminated or revoked.     Resp Syncytial Virus by PCR NEGATIVE NEGATIVE Final    Comment: (NOTE) Fact Sheet for Patients: bloggercourse.com  Fact Sheet for Healthcare  Providers: seriousbroker.it  This test is not yet approved or cleared by the [place]  FDA and has been authorized for detection  and/or diagnosis of SARS-CoV-2 by FDA under an Emergency Use Authorization (EUA). This EUA will remain in effect (meaning this test can be used) for the duration of the COVID-19 declaration under Section 564(b)(1) of the Act, 21 U.S.C. section 360bbb-3(b)(1), unless the authorization is terminated or revoked.  Performed at [facility], [address], [place], [place] [address]   Culture, blood (Routine X 2) w Reflex to ID Panel     Status: None (Preliminary result)   Collection Time: 12/12/24  6:53 PM   Specimen: BLOOD  Result Value Ref Range Status   Specimen Description BLOOD BLOOD RIGHT WRIST  Final   Special Requests   Final    BOTTLES DRAWN AEROBIC ONLY Blood Culture adequate volume   Culture   Final    NO GROWTH < 12 HOURS Performed at [facility], [address] [place], [place] [address]    Report Status PENDING  Incomplete  Culture, blood (Routine X 2) w Reflex to ID Panel     Status: None (Preliminary result)   Collection Time: 12/12/24  6:53 PM   Specimen: BLOOD  Result Value Ref Range Status   Specimen Description BLOOD LEFT ANTECUBITAL  Final   Special Requests   Final    BOTTLES DRAWN AEROBIC ONLY Blood Culture adequate volume   Culture   Final    NO GROWTH < 12 HOURS Performed at [facility], [address] [place], [place] [address]    Report Status PENDING  Incomplete  MRSA Next Gen by PCR, Nasal     Status: None   Collection Time: 12/12/24  8:00 PM   Specimen: Nasal Mucosa; Nasal Swab  Result Value Ref Range Status   MRSA by PCR Next Gen NOT DETECTED NOT DETECTED Final    Comment: (NOTE) The GeneXpert MRSA Assay (FDA approved for NASAL specimens only), is one component of a comprehensive MRSA colonization surveillance program. It is not intended to diagnose MRSA infection nor to guide or monitor  treatment for MRSA infections. Test performance is not FDA approved in patients less than 33 years old. Performed at [facility], [address], [place], [place] [address]     Radiology Studies: US  RENAL Result Date: 12/13/2024 EXAM: RETROPERITONEAL ULTRASOUND OF THE KIDNEYS 12/13/2024 09:09:30 AM TECHNIQUE: Real-time ultrasonography of the retroperitoneum, specifically the kidneys and urinary bladder, was performed. COMPARISON: US  Renal 11/04/2021; 09/09/2017. CLINICAL HISTORY: AKI (acute kidney injury). FINDINGS: RIGHT KIDNEY: Right kidney measures 10.2 x 4.0 x 5.4 cm. Normal cortical echogenicity. No hydronephrosis. No calculus. No mass. Right upper pole cyst measuring 2 x 3.2 x 2.8 cm. A smaller cyst is present measuring 1.8 x 1.8 x 2 cm. LEFT KIDNEY: Left kidney measures 8.4 x 4.0 x 5.1 cm. Mild cortical thinning throughout the left kidney with otherwise normal echogenicity. No hydronephrosis. No calculus. No mass. Small left interpolar region cyst measuring 1.2 x 1 x 1.4 cm. BLADDER: Unremarkable appearance of the bladder. OTHER VISUALIZED STRUCTURES: Minimal amount of biliary sludge is partially visualized. IMPRESSION: 1. Mild cortical thinning throughout the left kidney, which may be due to underlying chronic medical renal disease. Otherwise, no hydronephrosis or nephrolithiasis. Electronically signed by: Rogelia Myers MD MD 12/13/2024 09:27 AM EST RP Workstation: [id number]   CT CHEST WO CONTRAST Result Date: 12/12/2024 EXAM: CT CHEST WITHOUT CONTRAST 12/12/2024 04:37:29 PM TECHNIQUE: CT of the chest was performed without the administration of intravenous contrast. Multiplanar reformatted images are provided for review. Automated exposure control, iterative reconstruction, and/or weight based adjustment of  the mA/kV was utilized to reduce the radiation dose to as low as reasonably achievable. COMPARISON: Chest x-ray dated 12/12/2024. CLINICAL HISTORY: Abnormal x-ray - lung opacity/opacities.  FINDINGS: MEDIASTINUM: Heart and pericardium are unremarkable. Coronary and aortic atherosclerotic calcifications are noted. The central airways are clear. There are minimal secretions layering within the trachea. There is a small amount of fluid throughout the esophagus. LYMPH NODES: Difficult to assess for hilar adenopathy secondary to adjacent airspace disease and lack of contrast. There is likely at least 1 enlarged left hilar lymph node measuring 13 mm. No other enlarged mediastinal or hilar lymph nodes are seen. There are some prominent pretracheal and peritracheal lymph nodes. LUNGS AND PLEURA: There is a large area of dense consolidation throughout the posterior, lateral and inferior left upper lobe. Air bronchograms are present. Left upper lobe bronchus is patent. There is minimal dependent atelectasis in the bilateral lower lobes. There is an inferior right upper lobe pulmonary nodule measuring 5 mm on image 4/75. No other pulmonary nodules are seen. There is a trace left pleural effusion. No pneumothorax. 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 No acute fractures are seen. No cortical erosions are seen. There is a small sclerotic focus in the lateral left 7th rib without bony expansion or osseous destruction. No other focal osseous lesions are identified. UPPER ABDOMEN: Limited images of the upper abdomen demonstrates no acute abnormality. IMPRESSION: 1. Large area of dense consolidation throughout the posterior, lateral, and inferior left upper lobe with air bronchograms, with patent left upper lobe bronchus. 2. Trace left pleural effusion. 3. Likely mildly enlarged left hilar lymph node measuring 13 mm. 4. Inferior right upper lobe 5 mm pulmonary nodule; as per Fleischner Society Guidelines, no routine follow-up is recommended if low risk, and optional non-contrast chest CT at 12  months if high risk. Electronically signed by: Greig Pique MD MD 12/12/2024 04:47 PM EST RP Workstation: [id number]   DG Chest Portable 1 View Result Date: 12/12/2024 CLINICAL DATA:  Cough and flu exposure. EXAM: PORTABLE CHEST 1 VIEW COMPARISON:  None Available. FINDINGS: The heart size and mediastinal contours are within normal limits. A large hazy opacity is seen along the periphery of the mid to upper and mid to lower left lung. Mild left basilar atelectasis and/or infiltrate is also noted. No pneumothorax is identified. A chronic appearing deformity is seen extending through the surgical neck of the proximal left humerus with 1.5 cm distal displacement of the left humeral shaft. IMPRESSION: 1. Large left-sided pulmonary opacity which may represent a large area of airspace disease and/or pleural fluid. Correlation with chest CT is recommended as sequelae associated with an underlying neoplastic process cannot be excluded. 2. Mild left basilar atelectasis and/or infiltrate. 3. Chronic appearing deformity of the proximal left humerus. Electronically Signed   By: Suzen Dials M.D.   On: 12/12/2024 12:39   Scheduled Meds:  azithromycin   500 mg Oral Daily   Chlorhexidine  Gluconate Cloth  6 each Topical Daily   feeding supplement  237 mL Oral BID BM   heparin   5,000 Units Subcutaneous Q8H   insulin  aspart  0-5 Units Subcutaneous QHS   insulin  aspart  0-9 Units Subcutaneous TID WC   ipratropium-albuterol   3 mL Nebulization Q6H   multivitamin with minerals  1 tablet Oral Daily   Continuous Infusions:  cefTRIAXone  (ROCEPHIN )  IV 2 g (12/13/24 1401)    LOS: 1 day  Rendall Carwin M.D on 12/13/2024 at 4:04 PM  Go to [email or web address] - for contact info  [facility] - Office  [phone]  If 7PM-7AM, please contact night-coverage [email or web address] 12/13/2024, 4:04 PM    "

## 2024-12-13 NOTE — Progress Notes (Signed)
" °  Transition of Care [facility]) Screening Note   Patient Details  Name: Gabrielle Clay Date of Birth: 1951-01-21   Transition of Care [facility]) CM/SW Contact:    Hoy DELENA Bigness, LCSW Phone Number: 12/13/2024, 10:11 AM    Transition of Care Department [facility]) has reviewed patient and no TOC needs have been identified at this time. We will continue to monitor patient advancement through interdisciplinary progression rounds. If new patient transition needs arise, please place a TOC consult.    12/13/24 1011  TOC Brief Assessment  Insurance and Status Reviewed  Patient has primary care physician Yes  Home environment has been reviewed From home  Prior level of function: Independent  Prior/Current Home Services No current home services  Social Drivers of Health Review SDOH reviewed no interventions necessary  Readmission risk has been reviewed Yes  Transition of care needs no transition of care needs at this time    "

## 2024-12-13 NOTE — Plan of Care (Signed)

## 2024-12-14 DIAGNOSIS — I10 Essential (primary) hypertension: Secondary | ICD-10-CM | POA: Diagnosis not present

## 2024-12-14 DIAGNOSIS — N179 Acute kidney failure, unspecified: Secondary | ICD-10-CM | POA: Diagnosis not present

## 2024-12-14 DIAGNOSIS — J9601 Acute respiratory failure with hypoxia: Secondary | ICD-10-CM | POA: Diagnosis not present

## 2024-12-14 DIAGNOSIS — J189 Pneumonia, unspecified organism: Secondary | ICD-10-CM | POA: Diagnosis not present

## 2024-12-14 LAB — RENAL FUNCTION PANEL
Albumin: 2.8 g/dL — ABNORMAL LOW (ref 3.5–5.0)
Anion gap: 25 — ABNORMAL HIGH (ref 5–15)
BUN: 92 mg/dL — ABNORMAL HIGH (ref 8–23)
CO2: 12 mmol/L — ABNORMAL LOW (ref 22–32)
Calcium: 8.7 mg/dL — ABNORMAL LOW (ref 8.9–10.3)
Chloride: 97 mmol/L — ABNORMAL LOW (ref 98–111)
Creatinine, Ser: 2.91 mg/dL — ABNORMAL HIGH (ref 0.44–1.00)
GFR, Estimated: 16 mL/min — ABNORMAL LOW
Glucose, Bld: 231 mg/dL — ABNORMAL HIGH (ref 70–99)
Phosphorus: 8 mg/dL — ABNORMAL HIGH (ref 2.5–4.6)
Potassium: 3.7 mmol/L (ref 3.5–5.1)
Sodium: 133 mmol/L — ABNORMAL LOW (ref 135–145)

## 2024-12-14 LAB — GLUCOSE, CAPILLARY
Glucose-Capillary: 230 mg/dL — ABNORMAL HIGH (ref 70–99)
Glucose-Capillary: 355 mg/dL — ABNORMAL HIGH (ref 70–99)

## 2024-12-14 LAB — LEGIONELLA PNEUMOPHILA SEROGP 1 UR AG: L. pneumophila Serogp 1 Ur Ag: NEGATIVE

## 2024-12-14 MED ORDER — AZITHROMYCIN 500 MG PO TABS: 500.0000 mg | ORAL_TABLET | Freq: Every day | ORAL | 0 refills | Status: AC

## 2024-12-14 MED ORDER — ALBUTEROL SULFATE (2.5 MG/3ML) 0.083% IN NEBU: 2.5000 mg | INHALATION_SOLUTION | RESPIRATORY_TRACT | 12 refills | Status: AC | PRN

## 2024-12-14 MED ORDER — CHLORTHALIDONE 25 MG PO TABS: 12.5000 mg | ORAL_TABLET | Freq: Every day | ORAL | 2 refills | Status: AC

## 2024-12-14 MED ORDER — ASPIRIN 81 MG PO TABS: 81.0000 mg | ORAL_TABLET | Freq: Every day | ORAL | 11 refills | Status: AC

## 2024-12-14 MED ORDER — ALBUTEROL SULFATE HFA 108 (90 BASE) MCG/ACT IN AERS: 2.0000 | INHALATION_SPRAY | Freq: Four times a day (QID) | RESPIRATORY_TRACT | 2 refills | Status: AC | PRN

## 2024-12-14 MED ORDER — GUAIFENESIN ER 600 MG PO TB12: 600.0000 mg | ORAL_TABLET | Freq: Two times a day (BID) | ORAL | 0 refills | Status: AC

## 2024-12-14 MED ORDER — CEFUROXIME AXETIL 500 MG PO TABS: 500.0000 mg | ORAL_TABLET | Freq: Two times a day (BID) | ORAL | 0 refills | Status: AC

## 2024-12-14 MED ORDER — PREDNISONE 20 MG PO TABS: 40.0000 mg | ORAL_TABLET | Freq: Every day | ORAL | 0 refills | Status: AC

## 2024-12-14 MED ORDER — COMPRESSOR/NEBULIZER MISC: 1.0000 [IU] | Freq: Every day | 0 refills | Status: AC | PRN

## 2024-12-14 NOTE — Discharge Instructions (Signed)
 1)Very Low-salt diet advised---Less than 2 gm of Sodium per day advised----ok to use Mrs DASH salt substitute instead of Salt 2)Weigh yourself daily, call if you gain more than 3 pounds in 1 day or more than 5 pounds in 1 week as your diuretic medications may need to be adjusted 3)Avoid ibuprofen/Advil/Aleve /Motrin/Goody Powders/Naproxen /BC powders/Meloxicam/Diclofenac/Indomethacin and other Nonsteroidal anti-inflammatory medications as these will make you more likely to bleed and can cause stomach ulcers, can also cause Kidney problems.  4) complete abstinence from tobacco advised--- please use over-the-counter nicotine patch to help you quit smoking

## 2024-12-14 NOTE — Plan of Care (Signed)

## 2024-12-14 NOTE — Discharge Summary (Incomplete)
 "                                                                                   Gabrielle Clay, is a 74 y.o. female  DOB 1951-01-12  MRN [id number].  Admission date:  12/12/2024  Admitting Physician  Tully FORBES Carwin, MD  Discharge Date:  12/14/2024   Primary MD  Marvine Rush, MD  Recommendations for primary care physician for things to follow:   1)Very Low-salt diet advised---Less than 2 gm of Sodium per day advised----ok to use Mrs DASH salt substitute instead of Salt 2)Weigh yourself daily, call if you gain more than 3 pounds in 1 day or more than 5 pounds in 1 week as your diuretic medications may need to be adjusted 3)Avoid ibuprofen/Advil/Aleve /Motrin/Goody Powders/Naproxen /BC powders/Meloxicam/Diclofenac/Indomethacin and other Nonsteroidal anti-inflammatory medications as these will make you more likely to bleed and can cause stomach ulcers, can also cause Kidney problems.  4) complete abstinence from tobacco advised--- please use over-the-counter nicotine patch to help you quit smoking  Admission Diagnosis  PNA (pneumonia) [J18.9] Pneumonia of left upper lobe due to infectious organism [J18.9]   Discharge Diagnosis  PNA (pneumonia) [J18.9] Pneumonia of left upper lobe due to infectious organism [J18.9]    Principal Problem:   PNA (pneumonia) Active Problems:   Severe sepsis (HCC)   AKI (acute kidney injury)   Acute hypoxic respiratory failure (HCC)   Hypertension   Malnutrition of moderate degree      Past Medical History:  Diagnosis Date   GERD (gastroesophageal reflux disease)    Hypertension    Low back pain    Skin cancer    Thyroid  disease    hypothyroidism    Past Surgical History:  Procedure Laterality Date   BREAST BIOPSY     long time ago-pt doesn't remember- no scar   TUBAL LIGATION       HPI  from the history and physical done on the day of admission:   Patient coming from: Home   I have personally briefly reviewed patient's old medical  records in [facility]   Chief Complaint: Cough   HPI: Gabrielle Clay is a 74 y.o. female with medical history significant for hypertension.  Patient presented to the ED with complaints of cough, nonproductive, and generalized bodyaches of 5 days duration.  She denies difficulty breathing.  She reports 2 days of vomiting and diarrhea that has improved.  No abdominal pain.  No chest pain.  No lower extremity swelling.  No urinary symptoms.  No history of heart disease.  No problems swallowing.   ED Course: Temperature 97.9.  Heart rate reduced from 96-145 at the time of my evaluation, tachypneic respirate rate 18-33.  Blood pressure systolic 108-158.  O2 sats down to 87% on room air, placed on 5 L.  Sats currently 92% WBC 28.3. Anion gap-30, serum bicarb low at 11. Creatinine elevated 4.14. Sodium low 125. Mildly elevated ALP 156. CT chest without contrast-large area of dense consolidation throughout the posterior lateral inferior left upper lobe with air bronchograms. 500 mL bolus given, normal saline at 125 cc/h started. IV ceftriaxone  and azithromycin  started. Hospitalist admit for  pneumonia, hypoxia.   Review of Systems: As per HPI all other systems reviewed and negative.     Hospital Course:     Assessment and Plan: Brief Narrative:  74 y.o. female with medical history significant for  hypothyroidism, HTN, GERD and HLD admitted on 12/12/2020 with severe sepsis and acute hypoxic respiratory failure due to pneumonia     -Assessment and Plan: 1)Severe sepsis due to Pneumonia---POA -COVID/influenza/RSV negative.  CT chest shows large area of dense consolidation throughout the posterior, lateral and inferior left upper lobe with air bronchograms. -MRSA PCR Neg - - Urine strep and Legionella antigen pending =-IV Rocephin  and azithromycin  pending culture data WBC 28.3 >>28.9 -Add IV Solu-Medrol  -Continue mucolytics and bronchodilators   2)Acute hypoxic respiratory  failure- - Due to #1 above -Manage as above #1 -Currently requiring 6 L of oxygen via nasal cannula   3)AKI----acute kidney injury on CKD stage -3B  -creatinine on admission= 4.14  ,  baseline creatinine = no recent baseline available     ,  -creatinine is now= 3.89  , -- renally adjust medications, avoid nephrotoxic agents / dehydration  / hypotension --Renal ultrasound without obstructive uropathy - Continue to hold losartan  and chlorthalidone  - 4)Acute on chronic anemia--suspect baseline chronic anemia of CKD -Hgb drifting down due to hemodilution from IV fluids for sepsis protocol -No bleeding concerns Hgb currently greater than 9   5) acute hyponatremia--- sodium improving with hydration = Continue to hold chlorthalidone    6)Gastroenteritis-presenting with diarrhea and vomiting, resolving.  -- -Anion gap metabolic acidosis improving - Continue IV fluids   7)DM2-A1c 7.5 reflecting uncontrolled DM with hyperglycemia PTA -Hold PTA glipizide Use Novolog /Humalog Sliding scale insulin  with Accu-Cheks/Fingersticks as ordered    8)HTN--continue losartan  and metoprolol , avoid chlorthalidone  -IV hydralazine  as needed elevated BP - Disposition: The patient is from: Home              Anticipated d/c is to: Home   Discharge Condition: stable, no further hypoxia  Follow UP   Follow-up Information     Marvine Rush, MD. Schedule an appointment as soon as possible for a visit.   Specialty: Family Medicine Why: If symptoms worsen, As needed Contact information: [address] [place] [address] [phone]                  Consults obtained - ***  Diet and Activity recommendation:  As advised  Discharge Instructions    **** Discharge Instructions     Call MD for:  difficulty breathing, headache or visual disturbances   Complete by: As directed    Call MD for:  persistant dizziness or light-headedness   Complete by: As directed    Call MD for:  persistant  nausea and vomiting   Complete by: As directed    Call MD for:  temperature >100.4   Complete by: As directed    Diet - low sodium heart healthy   Complete by: As directed    Diet Carb Modified   Complete by: As directed    Discharge instructions   Complete by: As directed    1)Very Low-salt diet advised---Less than 2 gm of Sodium per day advised----ok to use Mrs DASH salt substitute instead of Salt 2)Weigh yourself daily, call if you gain more than 3 pounds in 1 day or more than 5 pounds in 1 week as your diuretic medications may need to be adjusted 3)Avoid ibuprofen/Advil/Aleve /Motrin/Goody Powders/Naproxen /BC powders/Meloxicam/Diclofenac/Indomethacin and other Nonsteroidal anti-inflammatory medications as these  will make you more likely to bleed and can cause stomach ulcers, can also cause Kidney problems.  4) complete abstinence from tobacco advised--- please use over-the-counter nicotine patch to help you quit smoking   For home use only DME Nebulizer machine   Complete by: As directed    Patient needs a nebulizer to treat with the following condition: Dyspnea and respiratory abnormalities   Length of Need: Lifetime   Additional equipment included:  Administration kit Filter     Increase activity slowly   Complete by: As directed          Discharge Medications     Allergies as of 12/14/2024       Reactions   Amlodipine Swelling   Swelling of feet   Sulfa Antibiotics Nausea Only        Medication List     STOP taking these medications    naproxen  500 MG tablet Commonly known as: Naprosyn        TAKE these medications    albuterol  (2.5 MG/3ML) 0.083% nebulizer solution Commonly known as: PROVENTIL  Take 3 mLs (2.5 mg total) by nebulization every 2 (two) hours as needed for wheezing or shortness of breath.   albuterol  108 (90 Base) MCG/ACT inhaler Commonly known as: VENTOLIN  HFA Inhale 2 puffs into the lungs every 6 (six) hours as needed for wheezing or  shortness of breath.   aspirin  81 MG tablet Take 1 tablet (81 mg total) by mouth daily with breakfast. What changed: when to take this   azithromycin  500 MG tablet Commonly known as: ZITHROMAX  Take 1 tablet (500 mg total) by mouth daily for 3 days.   cefUROXime  500 MG tablet Commonly known as: CEFTIN  Take 1 tablet (500 mg total) by mouth 2 (two) times daily with a meal for 5 days.   chlorthalidone  25 MG tablet Commonly known as: HYGROTON  Take 0.5 tablets (12.5 mg total) by mouth daily. What changed: how much to take   Compressor/Nebulizer Misc 1 Units by Does not apply route daily as needed.   Fish Oil 1000 MG Caps Take 1,000 mg by mouth 2 (two) times daily.   FLAX SEED OIL PO Take by mouth.   gemfibrozil 600 MG tablet Commonly known as: LOPID Take 600 mg by mouth 2 (two) times daily before a meal.   glipiZIDE 10 MG 24 hr tablet Commonly known as: GLUCOTROL XL Take 10 mg by mouth in the morning and at bedtime.   guaiFENesin  600 MG 12 hr tablet Commonly known as: Mucinex  Take 1 tablet (600 mg total) by mouth 2 (two) times daily.   levothyroxine  50 MCG tablet Commonly known as: SYNTHROID  Take 50 mcg by mouth daily before breakfast.   LOSARTAN  POTASSIUM PO Take 100 mg by mouth daily.   METOPROLOL  TARTRATE PO Take 25 mg by mouth in the morning and at bedtime.   pantoprazole  40 MG tablet Commonly known as: PROTONIX  Take 40 mg by mouth daily.   predniSONE  20 MG tablet Commonly known as: DELTASONE  Take 2 tablets (40 mg total) by mouth daily with breakfast for 5 days.               Durable Medical Equipment  (From admission, onward)           Start     Ordered   12/14/24 0000  For home use only DME Nebulizer machine       Question Answer Comment  Patient needs a nebulizer to treat with the following condition Dyspnea and  respiratory abnormalities   Length of Need Lifetime   Additional equipment included Administration kit   Additional equipment  included Filter      12/14/24 1440            Major procedures and Radiology Reports - PLEASE review detailed and final reports for all details, in brief -   ***  US  RENAL Result Date: 12/13/2024 EXAM: RETROPERITONEAL ULTRASOUND OF THE KIDNEYS 12/13/2024 09:09:30 AM TECHNIQUE: Real-time ultrasonography of the retroperitoneum, specifically the kidneys and urinary bladder, was performed. COMPARISON: US  Renal 11/04/2021; 09/09/2017. CLINICAL HISTORY: AKI (acute kidney injury). FINDINGS: RIGHT KIDNEY: Right kidney measures 10.2 x 4.0 x 5.4 cm. Normal cortical echogenicity. No hydronephrosis. No calculus. No mass. Right upper pole cyst measuring 2 x 3.2 x 2.8 cm. A smaller cyst is present measuring 1.8 x 1.8 x 2 cm. LEFT KIDNEY: Left kidney measures 8.4 x 4.0 x 5.1 cm. Mild cortical thinning throughout the left kidney with otherwise normal echogenicity. No hydronephrosis. No calculus. No mass. Small left interpolar region cyst measuring 1.2 x 1 x 1.4 cm. BLADDER: Unremarkable appearance of the bladder. OTHER VISUALIZED STRUCTURES: Minimal amount of biliary sludge is partially visualized. IMPRESSION: 1. Mild cortical thinning throughout the left kidney, which may be due to underlying chronic medical renal disease. Otherwise, no hydronephrosis or nephrolithiasis. Electronically signed by: Rogelia Myers MD MD 12/13/2024 09:27 AM EST RP Workstation: [id number]   CT CHEST WO CONTRAST Result Date: 12/12/2024 EXAM: CT CHEST WITHOUT CONTRAST 12/12/2024 04:37:29 PM TECHNIQUE: CT of the chest was performed without the administration of intravenous contrast. Multiplanar reformatted images are provided for review. Automated exposure control, iterative reconstruction, and/or weight based adjustment of the mA/kV was utilized to reduce the radiation dose to as low as reasonably achievable. COMPARISON: Chest x-ray dated 12/12/2024. CLINICAL HISTORY: Abnormal x-ray - lung opacity/opacities. FINDINGS: MEDIASTINUM: Heart  and pericardium are unremarkable. Coronary and aortic atherosclerotic calcifications are noted. The central airways are clear. There are minimal secretions layering within the trachea. There is a small amount of fluid throughout the esophagus. LYMPH NODES: Difficult to assess for hilar adenopathy secondary to adjacent airspace disease and lack of contrast. There is likely at least 1 enlarged left hilar lymph node measuring 13 mm. No other enlarged mediastinal or hilar lymph nodes are seen. There are some prominent pretracheal and peritracheal lymph nodes. LUNGS AND PLEURA: There is a large area of dense consolidation throughout the posterior, lateral and inferior left upper lobe. Air bronchograms are present. Left upper lobe bronchus is patent. There is minimal dependent atelectasis in the bilateral lower lobes. There is an inferior right upper lobe pulmonary nodule measuring 5 mm on image 4/75. No other pulmonary nodules are seen. There is a trace left pleural effusion. No pneumothorax. 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 No acute fractures are seen. No cortical erosions are seen. There is a small sclerotic focus in the lateral left 7th rib without bony expansion or osseous destruction. No other focal osseous lesions are identified. UPPER ABDOMEN: Limited images of the upper abdomen demonstrates no acute abnormality. IMPRESSION: 1. Large area of dense consolidation throughout the posterior, lateral, and inferior left upper lobe with air bronchograms, with patent left upper lobe bronchus. 2. Trace left pleural effusion. 3. Likely mildly enlarged left hilar lymph node measuring 13 mm. 4. Inferior right upper lobe 5 mm pulmonary nodule; as per Fleischner Society Guidelines, no routine follow-up is recommended  if low risk, and optional non-contrast chest CT at 12 months if high risk.  Electronically signed by: Greig Pique MD MD 12/12/2024 04:47 PM EST RP Workstation: [id number]   DG Chest Portable 1 View Result Date: 12/12/2024 CLINICAL DATA:  Cough and flu exposure. EXAM: PORTABLE CHEST 1 VIEW COMPARISON:  None Available. FINDINGS: The heart size and mediastinal contours are within normal limits. A large hazy opacity is seen along the periphery of the mid to upper and mid to lower left lung. Mild left basilar atelectasis and/or infiltrate is also noted. No pneumothorax is identified. A chronic appearing deformity is seen extending through the surgical neck of the proximal left humerus with 1.5 cm distal displacement of the left humeral shaft. IMPRESSION: 1. Large left-sided pulmonary opacity which may represent a large area of airspace disease and/or pleural fluid. Correlation with chest CT is recommended as sequelae associated with an underlying neoplastic process cannot be excluded. 2. Mild left basilar atelectasis and/or infiltrate. 3. Chronic appearing deformity of the proximal left humerus. Electronically Signed   By: Suzen Dials M.D.   On: 12/12/2024 12:39    Micro Results   *** Recent Results (from the past 240 hours)  Resp panel by RT-PCR (RSV, Flu A&B, Covid) Anterior Nasal Swab     Status: None   Collection Time: 12/12/24 12:48 PM   Specimen: Anterior Nasal Swab  Result Value Ref Range Status   SARS Coronavirus 2 by RT PCR NEGATIVE NEGATIVE Final    Comment: (NOTE) SARS-CoV-2 target nucleic acids are NOT DETECTED.  The SARS-CoV-2 RNA is generally detectable in upper respiratory specimens during the acute phase of infection. The lowest concentration of SARS-CoV-2 viral copies this assay can detect is 138 copies/mL. A negative result does not preclude SARS-Cov-2 infection and should not be used as the sole basis for treatment or other patient management decisions. A negative result may occur with  improper specimen collection/handling, submission of specimen  other than nasopharyngeal swab, presence of viral mutation(s) within the areas targeted by this assay, and inadequate number of viral copies(<138 copies/mL). A negative result must be combined with clinical observations, patient history, and epidemiological information. The expected result is Negative.  Fact Sheet for Patients:  bloggercourse.com  Fact Sheet for Healthcare Providers:  seriousbroker.it  This test is no t yet approved or cleared by the [place]  FDA and  has been authorized for detection and/or diagnosis of SARS-CoV-2 by FDA under an Emergency Use Authorization (EUA). This EUA will remain  in effect (meaning this test can be used) for the duration of the COVID-19 declaration under Section 564(b)(1) of the Act, 21 U.S.C.section 360bbb-3(b)(1), unless the authorization is terminated  or revoked sooner.       Influenza A by PCR NEGATIVE NEGATIVE Final   Influenza B by PCR NEGATIVE NEGATIVE Final    Comment: (NOTE) The Xpert Xpress SARS-CoV-2/FLU/RSV plus assay is intended as an aid in the diagnosis of influenza from Nasopharyngeal swab specimens and should not be used as a sole basis for treatment. Nasal washings and aspirates are unacceptable for Xpert Xpress SARS-CoV-2/FLU/RSV testing.  Fact Sheet for Patients: bloggercourse.com  Fact Sheet for Healthcare Providers: seriousbroker.it  This test is not yet approved or cleared by the [place]  FDA and has been authorized for detection and/or diagnosis of SARS-CoV-2 by FDA under an Emergency Use Authorization (EUA). This EUA will remain in effect (meaning this test can be used) for the duration of the COVID-19 declaration under Section 564(b)(1) of the  Act, 21 U.S.C. section 360bbb-3(b)(1), unless the authorization is terminated or revoked.     Resp Syncytial Virus by PCR NEGATIVE NEGATIVE Final     Comment: (NOTE) Fact Sheet for Patients: bloggercourse.com  Fact Sheet for Healthcare Providers: seriousbroker.it  This test is not yet approved or cleared by the [place]  FDA and has been authorized for detection and/or diagnosis of SARS-CoV-2 by FDA under an Emergency Use Authorization (EUA). This EUA will remain in effect (meaning this test can be used) for the duration of the COVID-19 declaration under Section 564(b)(1) of the Act, 21 U.S.C. section 360bbb-3(b)(1), unless the authorization is terminated or revoked.  Performed at [facility], [address] [place], [place] [address]   Culture, blood (Routine X 2) w Reflex to ID Panel     Status: None (Preliminary result)   Collection Time: 12/12/24  6:53 PM   Specimen: BLOOD  Result Value Ref Range Status   Specimen Description BLOOD BLOOD RIGHT WRIST  Final   Special Requests   Final    BOTTLES DRAWN AEROBIC ONLY Blood Culture adequate volume   Culture   Final    NO GROWTH 2 DAYS Performed at [facility], [address], [place], [place] [address]    Report Status PENDING  Incomplete  Culture, blood (Routine X 2) w Reflex to ID Panel     Status: None (Preliminary result)   Collection Time: 12/12/24  6:53 PM   Specimen: BLOOD  Result Value Ref Range Status   Specimen Description BLOOD LEFT ANTECUBITAL  Final   Special Requests   Final    BOTTLES DRAWN AEROBIC ONLY Blood Culture adequate volume   Culture   Final    NO GROWTH 2 DAYS Performed at [facility], [address], [place], [place] [address]    Report Status PENDING  Incomplete  MRSA Next Gen by PCR, Nasal     Status: None   Collection Time: 12/12/24  8:00 PM   Specimen: Nasal Mucosa; Nasal Swab  Result Value Ref Range Status   MRSA by PCR Next Gen NOT DETECTED NOT DETECTED Final    Comment: (NOTE) The GeneXpert MRSA Assay (FDA approved for NASAL specimens only), is one component of a comprehensive MRSA  colonization surveillance program. It is not intended to diagnose MRSA infection nor to guide or monitor treatment for MRSA infections. Test performance is not FDA approved in patients less than 12 years old. Performed at [facility], [address], [place], [place] [address]   Urine Culture (for pregnant, neutropenic or urologic patients or patients with an indwelling urinary catheter)     Status: Abnormal (Preliminary result)   Collection Time: 12/12/24 10:30 PM   Specimen: Urine, Clean Catch  Result Value Ref Range Status   Specimen Description   Final    URINE, CLEAN CATCH Performed at [facility], [address] [place], [place] [address]    Special Requests   Final    NONE Performed at [facility], [address], [place], [place] [address]    Culture 40,000 COLONIES/mL ENTEROCOCCUS FAECALIS (A)  Final   Report Status PENDING  Incomplete    Today   Subjective    Rosella Crandell today has no ***         -Patient's son Garen is at bedside, questions answered - Post ambulation O2 sats 90 to 92% on room air   Patient has been seen and examined prior to discharge   Objective   Blood pressure ROLLEN)  167/61, pulse 80, temperature 97.7 F (36.5 C), temperature source Oral, resp. rate (!) 21, height 4' 11 (1.499 m), weight 80.3 kg, SpO2 93%.   Intake/Output Summary (Last 24 hours) at 12/14/2024 1440 Last data filed at 12/14/2024 0014 Gross per 24 hour  Intake 561.71 ml  Output 480 ml  Net 81.71 ml    Exam Gen:- Awake Alert, no acute distress *** HEENT:- [place].AT, No sclera icterus Neck-Supple Neck,No JVD,.  Lungs-  CTAB , good air movement bilaterally CV- S1, S2 normal, regular Abd-  +ve B.Sounds, Abd Soft, No tenderness,    Extremity/Skin:- No  edema,   good pulses Psych-affect is appropriate, oriented x3 Neuro-no new focal deficits, no tremors ***   Data Review   CBC w Diff:  Lab Results  Component Value Date   WBC 28.9 (H) 12/13/2024   HGB 9.5 (L) 12/13/2024    HCT 29.5 (L) 12/13/2024   PLT 385 12/13/2024   LYMPHOPCT 5 12/12/2024   MONOPCT 3 12/12/2024   EOSPCT 1 12/12/2024   BASOPCT 1 12/12/2024    CMP:  Lab Results  Component Value Date   NA 133 (L) 12/14/2024   K 3.7 12/14/2024   CL 97 (L) 12/14/2024   CO2 12 (L) 12/14/2024   BUN 92 (H) 12/14/2024   CREATININE 2.91 (H) 12/14/2024   PROT 7.8 12/12/2024   ALBUMIN 2.8 (L) 12/14/2024   BILITOT 0.5 12/12/2024   ALKPHOS 156 (H) 12/12/2024   AST 71 (H) 12/12/2024   ALT 26 12/12/2024  .  Total Discharge time is about 33 minutes  Rendall Carwin M.D on 12/14/2024 at 2:40 PM  Go to [email or web address] -  for contact info  [facility] - Office  [phone]   "

## 2024-12-14 NOTE — Plan of Care (Signed)

## 2024-12-14 NOTE — TOC Transition Note (Addendum)
 Transition of Care [facility]) - Discharge Note   Patient Details  Name: Gabrielle Clay MRN: [id number] Date of Birth: August 12, 1951  Transition [facility] CM/SW Contact:  Hoy DELENA Bigness, LCSW Phone Number: 12/14/2024, 2:59 PM   Clinical Narrative:    Nebulizer machine ordered through [employer] to be delivered to pt's home.   Final next level of care: Home/Self Care Barriers to Discharge: No Barriers Identified   Patient Goals and CMS Choice Patient states their goals for this hospitalization and ongoing recovery are:: To return home CMS Medicare.gov Compare Post Acute Care list provided to:: Other (Comment Required) Choice offered to / list presented to : Patient      Discharge Placement                       Discharge Plan and Services Additional resources added to the After Visit Summary for                  DME Arranged: Nebulizer machine DME Agency: [employer] Date DME Agency Contacted: 12/14/24 Time DME Agency Contacted: 1459 Representative spoke with at DME Agency: Darlyn            Social Drivers of Health (SDOH) Interventions SDOH Screenings   Food Insecurity: No Food Insecurity (12/12/2024)  Housing: Low Risk (12/12/2024)  Transportation Needs: No Transportation Needs (12/12/2024)  Utilities: Not At Risk (12/12/2024)  Alcohol Screen: Low Risk (01/04/2022)  Depression (PHQ2-9): Low Risk (01/04/2022)  Financial Resource Strain: Low Risk (01/04/2022)  Physical Activity: Insufficiently Active (01/04/2022)  Social Connections: Moderately Isolated (12/12/2024)  Stress: No Stress Concern Present (01/04/2022)  Tobacco Use: High Risk (12/12/2024)     Readmission Risk Interventions    12/14/2024    2:58 PM 12/13/2024   10:10 AM  Readmission Risk Prevention Plan  Post Dischage Appt  Complete  Medication Screening  Complete  Transportation Screening Complete Complete  PCP or Specialist Appt within 5-7 Days Complete   Home Care Screening Complete   Medication  Review (RN CM) Complete

## 2024-12-14 NOTE — Progress Notes (Signed)
SATURATION QUALIFICATIONS: (This note is used to comply with regulatory documentation for home oxygen)  Patient Saturations on Room Air at Rest =94%  Patient Saturations on Room Air while Ambulating = 90%  Patient Saturations on0 Liters of oxygen while Ambulating =90%  Please briefly explain why patient needs home oxygen: 

## 2024-12-14 NOTE — Inpatient Diabetes Management (Signed)
 Inpatient Diabetes Program Recommendations  AACE/ADA: New Consensus Statement on Inpatient Glycemic Control (2015)  Target Ranges:  Prepandial:   less than 140 mg/dL      Peak postprandial:   less than 180 mg/dL (1-2 hours)      Critically ill patients:  140 - 180 mg/dL   Lab Results  Component Value Date   GLUCAP 230 (H) 12/14/2024   HGBA1C 7.5 (H) 12/12/2024    Latest Reference Range & Units 12/13/24 07:42 12/13/24 11:22 12/13/24 16:24 12/13/24 21:43 12/14/24 07:27  Glucose-Capillary 70 - 99 mg/dL 891 (H) 858 (H) 750 (H) 236 (H) 230 (H)  (H): Data is abnormally high  Diabetes history: DM2 Outpatient Diabetes medications:  Glucotrol 10 mg daily Current orders for Inpatient glycemic control: Novolog  0-9 units tid, 0-5 units hs correction Solumedrol 40 mg q 12 hrs.  Inpatient Diabetes Program Recommendations:   Consider while on steroids: -Change Novolog  correction to q 4 hrs.  Thank you, Moosa Bueche E. Daemon Dowty, RN, MSN, CNS, CDCES  Diabetes Coordinator Inpatient Glycemic Control Team Team Pager [phone] (8am-5pm) 12/14/2024 11:00 AM

## 2024-12-15 ENCOUNTER — Other Ambulatory Visit: Payer: Self-pay | Admitting: Family Medicine

## 2024-12-15 LAB — URINE CULTURE: Culture: 40000 — AB

## 2024-12-15 MED ORDER — SODIUM BICARBONATE 650 MG PO TABS: 650.0000 mg | ORAL_TABLET | Freq: Two times a day (BID) | ORAL | 1 refills | Status: AC

## 2024-12-15 NOTE — Progress Notes (Signed)
 Patient has metabolic acidosis bicarb ordered

## 2024-12-15 NOTE — Discharge Summary (Signed)
 "                                                                                 Gabrielle [place], is a 74 y.o. female  DOB [birth year]  MRN [id number].  Admission date:  12/12/2024  Admitting Physician  Tully FORBES Carwin, MD  Discharge Date:  12/15/2024   Primary MD  Marvine Rush, MD  Recommendations for primary care physician for things to follow:  1)Very Low-salt diet advised---Less than 2 gm of Sodium per day advised----ok to use Mrs DASH salt substitute instead of Salt 2)Weigh yourself daily, call if you gain more than 3 pounds in 1 day or more than 5 pounds in 1 week as your diuretic medications may need to be adjusted 3)Avoid ibuprofen/Advil/Aleve /Motrin/Goody Powders/Naproxen /BC powders/Meloxicam/Diclofenac/Indomethacin and other Nonsteroidal anti-inflammatory medications as these will make you more likely to bleed and can cause stomach ulcers, can also cause Kidney problems.  4) complete abstinence from tobacco advised--- please use over-the-counter nicotine patch to help you quit smoking  Admission Diagnosis  PNA (pneumonia) [J18.9] Pneumonia of left upper lobe due to infectious organism [J18.9]   Discharge Diagnosis  PNA (pneumonia) [J18.9] Pneumonia of left upper lobe due to infectious organism [J18.9]    Principal Problem:   PNA (pneumonia) Active Problems:   Severe sepsis (HCC)   AKI (acute kidney injury)   Acute hypoxic respiratory failure (HCC)   Hypertension   Malnutrition of moderate degree      Past Medical History:  Diagnosis Date   GERD (gastroesophageal reflux disease)    Hypertension    Low back pain    Skin cancer    Thyroid  disease    hypothyroidism    Past Surgical History:  Procedure Laterality Date   BREAST BIOPSY     long time ago-pt doesn't remember- no scar   TUBAL LIGATION         HPI  from the history and physical done on the day of admission:   Patient coming from: Home   I have personally briefly reviewed patient's old medical  records in [facility]   Chief Complaint: Cough   HPI: Gabrielle Clay is a 74 y.o. female with medical history significant for hypertension.  Patient presented to the ED with complaints of cough, nonproductive, and generalized bodyaches of 5 days duration.  She denies difficulty breathing.  She reports 2 days of vomiting and diarrhea that has improved.  No abdominal pain.  No chest pain.  No lower extremity swelling.  No urinary symptoms.  No history of heart disease.  No problems swallowing.   ED Course: Temperature 97.9.  Heart rate reduced from 96-145 at the time of my evaluation, tachypneic respirate rate 18-33.  Blood pressure systolic 108-158.  O2 sats down to 87% on room air, placed on 5 L.  Sats currently 92% WBC 28.3. Anion gap-30, serum bicarb low at 11. Creatinine elevated 4.14. Sodium low 125. Mildly elevated ALP 156. CT chest without contrast-large area of dense consolidation throughout the posterior lateral inferior left upper lobe with air bronchograms. 500 mL bolus given, normal saline at 125 cc/h started. IV ceftriaxone  and azithromycin  started. Hospitalist admit for pneumonia,  hypoxia.   Review of Systems: As per HPI all other systems reviewed and negative.     Hospital Course:     Brief Narrative:  74 y.o. female with medical history significant for  hypothyroidism, HTN, GERD and HLD admitted on 12/12/2020 with severe sepsis and acute hypoxic respiratory failure due to pneumonia     -Assessment and Plan: 1)Severe Sepsis due to Pneumonia---POA -COVID/influenza/RSV negative.  CT chest shows large area of dense consolidation throughout the posterior, lateral and inferior left upper lobe with air bronchograms. -MRSA PCR Neg - - Urine strep antigen Positive Legionella antigen Neg - Treated with Rocephin  and azithromycin  and IV Solu-Medrol  WBC 28.3 >>28.9 -Overall much improved from a respiratory standpoint -Okay to discharge home on p.o. prednisone , cefuroxime   and azithromycin  -Continue mucolytics and bronchodilators   2)Acute hypoxic respiratory failure- - Due to #1 above - Much improved respiratory standpoint -Hypoxia has resolved currently on room air -Post ambulation O2 sats 90 to 91% on room   3)AKI----acute kidney injury on CKD stage -3B  -creatinine on admission= 4.14  ,  baseline creatinine = no recent baseline available     ,  -creatinine 4.14 >> 3.89 >> 2.91 -- renally adjust medications, avoid nephrotoxic agents / dehydration  / hypotension --Renal ultrasound without obstructive uropathy -Back Prescribed due to persistent metabolic acidosis - 4)Acute on chronic anemia--suspect baseline chronic anemia of CKD -Hgb drifting down due to hemodilution from IV fluids for sepsis protocol -No bleeding concerns Hgb currently greater than 9   5) acute hyponatremia--- sodium improving with hydration Na 125 >>131 >>133   6)Gastroenteritis-presenting with diarrhea and vomiting, resolved.  - Avoid dehydration  7)DM2-A1c 7.5 reflecting uncontrolled DM with hyperglycemia PTA -Hold PTA glipizide Use Novolog /Humalog Sliding scale insulin  with Accu-Cheks/Fingersticks as ordered    8)HTN-- resume PTA BP meds-   Disposition: The patient is from: Home              Anticipated d/c is to: Home  Discharge Condition: stable  Follow UP   Follow-up Information     Marvine Rush, MD. Schedule an appointment as soon as possible for a visit.   Specialty: Family Medicine Why: If symptoms worsen, As needed Contact information: [address] [place] [address] [place] [address] [phone]         [facility], [facility] Follow up.   Why: [facility] to deliver Nebulizer Machine to your house Contact information: [address] [address] [phone]                 Diet and Activity recommendation:  As advised  Discharge Instructions    Discharge Instructions     Call MD for:  difficulty breathing, headache or  visual disturbances   Complete by: As directed    Call MD for:  persistant dizziness or light-headedness   Complete by: As directed    Call MD for:  persistant nausea and vomiting   Complete by: As directed    Call MD for:  temperature >100.4   Complete by: As directed    Diet - low sodium heart healthy   Complete by: As directed    Diet Carb Modified   Complete by: As directed    Discharge instructions   Complete by: As directed    1)Very Low-salt diet advised---Less than 2 gm of Sodium per day advised----ok to use Mrs DASH salt substitute instead of Salt 2)Weigh yourself daily, call if you gain more than 3 pounds  in 1 day or more than 5 pounds in 1 week as your diuretic medications may need to be adjusted 3)Avoid ibuprofen/Advil/Aleve /Motrin/Goody Powders/Naproxen /BC powders/Meloxicam/Diclofenac/Indomethacin and other Nonsteroidal anti-inflammatory medications as these will make you more likely to bleed and can cause stomach ulcers, can also cause Kidney problems.  4) complete abstinence from tobacco advised--- please use over-the-counter nicotine patch to help you quit smoking   For home use only DME Nebulizer machine   Complete by: As directed    Patient needs a nebulizer to treat with the following condition: Dyspnea and respiratory abnormalities   Length of Need: Lifetime   Additional equipment included:  Administration kit Filter     Increase activity slowly   Complete by: As directed        Discharge Medications     Allergies as of 12/14/2024       Reactions   Amlodipine Swelling   Swelling of feet   Sulfa Antibiotics Nausea Only        Medication List     STOP taking these medications    naproxen  500 MG tablet Commonly known as: Naprosyn        TAKE these medications    albuterol  (2.5 MG/3ML) 0.083% nebulizer solution Commonly known as: PROVENTIL  Take 3 mLs (2.5 mg total) by nebulization every 2 (two) hours as needed for wheezing or shortness of  breath.   albuterol  108 (90 Base) MCG/ACT inhaler Commonly known as: VENTOLIN  HFA Inhale 2 puffs into the lungs every 6 (six) hours as needed for wheezing or shortness of breath.   aspirin  81 MG tablet Take 1 tablet (81 mg total) by mouth daily with breakfast. What changed: when to take this   azithromycin  500 MG tablet Commonly known as: ZITHROMAX  Take 1 tablet (500 mg total) by mouth daily for 3 days.   cefUROXime  500 MG tablet Commonly known as: CEFTIN  Take 1 tablet (500 mg total) by mouth 2 (two) times daily with a meal for 5 days.   chlorthalidone  25 MG tablet Commonly known as: HYGROTON  Take 0.5 tablets (12.5 mg total) by mouth daily. What changed: how much to take   Compressor/Nebulizer Misc 1 Units by Does not apply route daily as needed.   Fish Oil 1000 MG Caps Take 1,000 mg by mouth 2 (two) times daily.   FLAX SEED OIL PO Take by mouth.   gemfibrozil 600 MG tablet Commonly known as: LOPID Take 600 mg by mouth 2 (two) times daily before a meal.   glipiZIDE 10 MG 24 hr tablet Commonly known as: GLUCOTROL XL Take 10 mg by mouth in the morning and at bedtime.   guaiFENesin  600 MG 12 hr tablet Commonly known as: Mucinex  Take 1 tablet (600 mg total) by mouth 2 (two) times daily.   levothyroxine  50 MCG tablet Commonly known as: SYNTHROID  Take 50 mcg by mouth daily before breakfast.   LOSARTAN  POTASSIUM PO Take 100 mg by mouth daily.   METOPROLOL  TARTRATE PO Take 25 mg by mouth in the morning and at bedtime.   pantoprazole  40 MG tablet Commonly known as: PROTONIX  Take 40 mg by mouth daily.   predniSONE  20 MG tablet Commonly known as: DELTASONE  Take 2 tablets (40 mg total) by mouth daily with breakfast for 5 days.               Durable Medical Equipment  (From admission, onward)           Start     Ordered   12/14/24 0000  For home use only DME Nebulizer machine       Question Answer Comment  Patient needs a nebulizer to treat with the  following condition Dyspnea and respiratory abnormalities   Length of Need Lifetime   Additional equipment included Administration kit   Additional equipment included Filter      12/14/24 1440           Major procedures and Radiology Reports - PLEASE review detailed and final reports for all details, in brief -   US  RENAL Result Date: 12/13/2024 EXAM: RETROPERITONEAL ULTRASOUND OF THE KIDNEYS 12/13/2024 09:09:30 AM TECHNIQUE: Real-time ultrasonography of the retroperitoneum, specifically the kidneys and urinary bladder, was performed. COMPARISON: US  Renal 11/04/2021; 09/09/2017. CLINICAL HISTORY: AKI (acute kidney injury). FINDINGS: RIGHT KIDNEY: Right kidney measures 10.2 x 4.0 x 5.4 cm. Normal cortical echogenicity. No hydronephrosis. No calculus. No mass. Right upper pole cyst measuring 2 x 3.2 x 2.8 cm. A smaller cyst is present measuring 1.8 x 1.8 x 2 cm. LEFT KIDNEY: Left kidney measures 8.4 x 4.0 x 5.1 cm. Mild cortical thinning throughout the left kidney with otherwise normal echogenicity. No hydronephrosis. No calculus. No mass. Small left interpolar region cyst measuring 1.2 x 1 x 1.4 cm. BLADDER: Unremarkable appearance of the bladder. OTHER VISUALIZED STRUCTURES: Minimal amount of biliary sludge is partially visualized. IMPRESSION: 1. Mild cortical thinning throughout the left kidney, which may be due to underlying chronic medical renal disease. Otherwise, no hydronephrosis or nephrolithiasis. Electronically signed by: Rogelia Myers MD MD 12/13/2024 09:27 AM EST RP Workstation: [id number]   CT CHEST WO CONTRAST Result Date: 12/12/2024 EXAM: CT CHEST WITHOUT CONTRAST 12/12/2024 04:37:29 PM TECHNIQUE: CT of the chest was performed without the administration of intravenous contrast. Multiplanar reformatted images are provided for review. Automated exposure control, iterative reconstruction, and/or weight based adjustment of the mA/kV was utilized to reduce the radiation dose to as low as  reasonably achievable. COMPARISON: Chest x-ray dated 12/12/2024. CLINICAL HISTORY: Abnormal x-ray - lung opacity/opacities. FINDINGS: MEDIASTINUM: Heart and pericardium are unremarkable. Coronary and aortic atherosclerotic calcifications are noted. The central airways are clear. There are minimal secretions layering within the trachea. There is a small amount of fluid throughout the esophagus. LYMPH NODES: Difficult to assess for hilar adenopathy secondary to adjacent airspace disease and lack of contrast. There is likely at least 1 enlarged left hilar lymph node measuring 13 mm. No other enlarged mediastinal or hilar lymph nodes are seen. There are some prominent pretracheal and peritracheal lymph nodes. LUNGS AND PLEURA: There is a large area of dense consolidation throughout the posterior, lateral and inferior left upper lobe. Air bronchograms are present. Left upper lobe bronchus is patent. There is minimal dependent atelectasis in the bilateral lower lobes. There is an inferior right upper lobe pulmonary nodule measuring 5 mm on image 4/75. No other pulmonary nodules are seen. There is a trace left pleural effusion. No pneumothorax. 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 No acute fractures are seen. No cortical erosions are seen. There is a small sclerotic focus in the lateral left 7th rib without bony expansion or osseous destruction. No other focal osseous lesions are identified. UPPER ABDOMEN: Limited images of the upper abdomen demonstrates no acute abnormality. IMPRESSION: 1. Large area of dense consolidation throughout the posterior, lateral, and inferior left upper lobe with air bronchograms, with patent left upper lobe bronchus. 2. Trace left pleural effusion. 3. Likely mildly enlarged  left hilar lymph node measuring 13 mm. 4. Inferior right upper lobe 5 mm pulmonary nodule; as per  Fleischner Society Guidelines, no routine follow-up is recommended if low risk, and optional non-contrast chest CT at 12 months if high risk. Electronically signed by: Greig Pique MD MD 12/12/2024 04:47 PM EST RP Workstation: [id number]   DG Chest Portable 1 View Result Date: 12/12/2024 CLINICAL DATA:  Cough and flu exposure. EXAM: PORTABLE CHEST 1 VIEW COMPARISON:  None Available. FINDINGS: The heart size and mediastinal contours are within normal limits. A large hazy opacity is seen along the periphery of the mid to upper and mid to lower left lung. Mild left basilar atelectasis and/or infiltrate is also noted. No pneumothorax is identified. A chronic appearing deformity is seen extending through the surgical neck of the proximal left humerus with 1.5 cm distal displacement of the left humeral shaft. IMPRESSION: 1. Large left-sided pulmonary opacity which may represent a large area of airspace disease and/or pleural fluid. Correlation with chest CT is recommended as sequelae associated with an underlying neoplastic process cannot be excluded. 2. Mild left basilar atelectasis and/or infiltrate. 3. Chronic appearing deformity of the proximal left humerus. Electronically Signed   By: Suzen Dials M.D.   On: 12/12/2024 12:39   Micro Results   Recent Results (from the past 240 hours)  Resp panel by RT-PCR (RSV, Flu A&B, Covid) Anterior Nasal Swab     Status: None   Collection Time: 12/12/24 12:48 PM   Specimen: Anterior Nasal Swab  Result Value Ref Range Status   SARS Coronavirus 2 by RT PCR NEGATIVE NEGATIVE Final    Comment: (NOTE) SARS-CoV-2 target nucleic acids are NOT DETECTED.  The SARS-CoV-2 RNA is generally detectable in upper respiratory specimens during the acute phase of infection. The lowest concentration of SARS-CoV-2 viral copies this assay can detect is 138 copies/mL. A negative result does not preclude SARS-Cov-2 infection and should not be used as the sole basis for treatment  or other patient management decisions. A negative result may occur with  improper specimen collection/handling, submission of specimen other than nasopharyngeal swab, presence of viral mutation(s) within the areas targeted by this assay, and inadequate number of viral copies(<138 copies/mL). A negative result must be combined with clinical observations, patient history, and epidemiological information. The expected result is Negative.  Fact Sheet for Patients:  bloggercourse.com  Fact Sheet for Healthcare Providers:  seriousbroker.it  This test is no t yet approved or cleared by the [place]  FDA and  has been authorized for detection and/or diagnosis of SARS-CoV-2 by FDA under an Emergency Use Authorization (EUA). This EUA will remain  in effect (meaning this test can be used) for the duration of the COVID-19 declaration under Section 564(b)(1) of the Act, 21 U.S.C.section 360bbb-3(b)(1), unless the authorization is terminated  or revoked sooner.       Influenza A by PCR NEGATIVE NEGATIVE Final   Influenza B by PCR NEGATIVE NEGATIVE Final    Comment: (NOTE) The Xpert Xpress SARS-CoV-2/FLU/RSV plus assay is intended as an aid in the diagnosis of influenza from Nasopharyngeal swab specimens and should not be used as a sole basis for treatment. Nasal washings and aspirates are unacceptable for Xpert Xpress SARS-CoV-2/FLU/RSV testing.  Fact Sheet for Patients: bloggercourse.com  Fact Sheet for Healthcare Providers: seriousbroker.it  This test is not yet approved or cleared by the [place]  FDA and has been authorized for detection and/or diagnosis of SARS-CoV-2 by FDA under an Emergency Use Authorization (EUA).  This EUA will remain in effect (meaning this test can be used) for the duration of the COVID-19 declaration under Section 564(b)(1) of the Act, 21 U.S.C. section  360bbb-3(b)(1), unless the authorization is terminated or revoked.     Resp Syncytial Virus by PCR NEGATIVE NEGATIVE Final    Comment: (NOTE) Fact Sheet for Patients: bloggercourse.com  Fact Sheet for Healthcare Providers: seriousbroker.it  This test is not yet approved or cleared by the [place]  FDA and has been authorized for detection and/or diagnosis of SARS-CoV-2 by FDA under an Emergency Use Authorization (EUA). This EUA will remain in effect (meaning this test can be used) for the duration of the COVID-19 declaration under Section 564(b)(1) of the Act, 21 U.S.C. section 360bbb-3(b)(1), unless the authorization is terminated or revoked.  Performed at [facility], [address] [place], [place] [address]   Culture, blood (Routine X 2) w Reflex to ID Panel     Status: None (Preliminary result)   Collection Time: 12/12/24  6:53 PM   Specimen: BLOOD  Result Value Ref Range Status   Specimen Description BLOOD BLOOD RIGHT WRIST  Final   Special Requests   Final    BOTTLES DRAWN AEROBIC ONLY Blood Culture adequate volume   Culture   Final    NO GROWTH 3 DAYS Performed at [facility], [address], [place], [place] [address]    Report Status PENDING  Incomplete  Culture, blood (Routine X 2) w Reflex to ID Panel     Status: None (Preliminary result)   Collection Time: 12/12/24  6:53 PM   Specimen: BLOOD  Result Value Ref Range Status   Specimen Description BLOOD LEFT ANTECUBITAL  Final   Special Requests   Final    BOTTLES DRAWN AEROBIC ONLY Blood Culture adequate volume   Culture   Final    NO GROWTH 3 DAYS Performed at [facility], [address], [place], [place] [address]    Report Status PENDING  Incomplete  MRSA Next Gen by PCR, Nasal     Status: None   Collection Time: 12/12/24  8:00 PM   Specimen: Nasal Mucosa; Nasal Swab  Result Value Ref Range Status   MRSA by PCR Next Gen NOT DETECTED NOT DETECTED  Final    Comment: (NOTE) The GeneXpert MRSA Assay (FDA approved for NASAL specimens only), is one component of a comprehensive MRSA colonization surveillance program. It is not intended to diagnose MRSA infection nor to guide or monitor treatment for MRSA infections. Test performance is not FDA approved in patients less than 28 years old. Performed at [facility], [address], [place], [place] [address]   Urine Culture (for pregnant, neutropenic or urologic patients or patients with an indwelling urinary catheter)     Status: Abnormal   Collection Time: 12/12/24 10:30 PM   Specimen: Urine, Clean Catch  Result Value Ref Range Status   Specimen Description   Final    URINE, CLEAN CATCH Performed at [facility], [address], [place], [place] [address]    Special Requests   Final    NONE Performed at [facility], [address], [place], [place] [address]    Culture 40,000 COLONIES/mL ENTEROCOCCUS FAECALIS (A)  Final   Report Status 12/15/2024 FINAL  Final   Organism ID, Bacteria ENTEROCOCCUS FAECALIS (A)  Final      Susceptibility   Enterococcus faecalis - MIC*    AMPICILLIN <=2 SENSITIVE Sensitive     NITROFURANTOIN 32 SENSITIVE Sensitive  VANCOMYCIN 2 SENSITIVE Sensitive     * 40,000 COLONIES/mL ENTEROCOCCUS FAECALIS   Today   Subjective    Tiyana Galla today has no new complaints No fever  Or chills  Eating and drinking well -Ambulating around the unit without significant dyspnea on exertion, no desaturation, no chest pains or palpitation  Patient has been seen and examined prior to discharge   Objective   Blood pressure (!) 167/61, pulse 80, temperature 97.7 F (36.5 C), temperature source Oral, resp. rate (!) 21, height 4' 11 (1.499 m), weight 80.3 kg, SpO2 94%.  No intake or output data in the 24 hours ending 12/15/24 2025  Exam Gen:- Awake Alert, no acute distress, no conversational dyspnea HEENT:- [place].AT, No sclera icterus Neck-Supple Neck,No JVD,.   Lungs-improved air movement, no wheezing CV- S1, S2 normal, regular Abd-  +ve B.Sounds, Abd Soft, No tenderness,    Extremity/Skin:- No  edema,   good pulses Psych-affect is appropriate, oriented x3 Neuro-no new focal deficits, no tremors    Data Review   CBC w Diff:  Lab Results  Component Value Date   WBC 28.9 (H) 12/13/2024   HGB 9.5 (L) 12/13/2024   HCT 29.5 (L) 12/13/2024   PLT 385 12/13/2024   LYMPHOPCT 5 12/12/2024   MONOPCT 3 12/12/2024   EOSPCT 1 12/12/2024   BASOPCT 1 12/12/2024    CMP:  Lab Results  Component Value Date   NA 133 (L) 12/14/2024   K 3.7 12/14/2024   CL 97 (L) 12/14/2024   CO2 12 (L) 12/14/2024   BUN 92 (H) 12/14/2024   CREATININE 2.91 (H) 12/14/2024   PROT 7.8 12/12/2024   ALBUMIN 2.8 (L) 12/14/2024   BILITOT 0.5 12/12/2024   ALKPHOS 156 (H) 12/12/2024   AST 71 (H) 12/12/2024   ALT 26 12/12/2024  .  Total Discharge time is about 33 minutes  Rendall Carwin M.D on 12/15/2024 at 8:25 PM  Go to [email or web address] -  for contact info  [facility] - Office  [phone]  12/15/2024 "

## 2024-12-17 LAB — CULTURE, BLOOD (ROUTINE X 2)
Culture: NO GROWTH
Culture: NO GROWTH
Special Requests: ADEQUATE
Special Requests: ADEQUATE

## 2024-12-17 NOTE — Progress Notes (Signed)
 Gabrielle Clay                                          MRN: [id number]   12/17/2024   The [facility] Quality Team Specialist reviewed this patient medical record for the purposes of chart review for care gap closure. The following were reviewed: chart review for care gap closure-kidney health evaluation for diabetes:eGFR  and uACR.    [facility] Quality Team

## 2024-12-24 ENCOUNTER — Ambulatory Visit
Admission: EM | Admit: 2024-12-24 | Discharge: 2024-12-24 | Disposition: A | Discharge status: Home / Self Care | Attending: Family Medicine | Admitting: Family Medicine

## 2024-12-24 DIAGNOSIS — H6121 Impacted cerumen, right ear: Secondary | ICD-10-CM

## 2024-12-24 DIAGNOSIS — B370 Candidal stomatitis: Secondary | ICD-10-CM

## 2024-12-24 MED ORDER — NYSTATIN 100000 UNIT/ML MT SUSP: 500000.0000 [IU] | Freq: Four times a day (QID) | OROMUCOSAL | 0 refills | Status: AC

## 2024-12-24 MED ORDER — CARBAMIDE PEROXIDE 6.5 % OT SOLN: 5.0000 [drp] | Freq: Two times a day (BID) | OTIC | 0 refills | Status: AC | PRN

## 2024-12-24 MED ORDER — LIDOCAINE VISCOUS HCL 2 % MT SOLN: 10.0000 mL | OROMUCOSAL | 0 refills | Status: AC | PRN

## 2024-12-24 NOTE — ED Triage Notes (Signed)
 Pt states she is having pain in her mouth/ tongue and a sore throat, ear fullness x 3 weeks.

## 2024-12-25 NOTE — ED Provider Notes (Signed)
 " [facility]    CSN: [id number] Arrival date & time: 12/24/24  1123      History   Chief Complaint Chief Complaint  Patient presents with   Sore Throat    HPI Gabrielle Clay is a 74 y.o. female.   Patient presenting today with several week history of mouth, tongue, throat pain, swelling sensation.  States at times feels like there is a coating on them.  Denies difficulty breathing or swallowing, fevers, chills, cough, congestion.  Was admitted on 12/12/2024 for pneumonia and has been on multiple antibiotics for this.  So far not trying anything over-the-counter for her current symptoms.  Also complaining of right ear fullness.    Past Medical History:  Diagnosis Date   GERD (gastroesophageal reflux disease)    Hypertension    Low back pain    Skin cancer    Thyroid  disease    hypothyroidism    Patient Active Problem List   Diagnosis Date Noted   Malnutrition of moderate degree 12/13/2024   PNA (pneumonia) 12/12/2024   Severe sepsis (HCC) 12/12/2024   AKI (acute kidney injury) 12/12/2024   Acute hypoxic respiratory failure (HCC) 12/12/2024   Rectocele 01/04/2022   Encounter for screening fecal occult blood testing 01/04/2022   Encounter for gynecological examination with Papanicolaou smear of cervix 01/04/2022   Hypertension 01/04/2022   Leukocytosis 04/28/2020   Dyslipidemia 07/22/2015    Past Surgical History:  Procedure Laterality Date   BREAST BIOPSY     long time ago-pt doesn't remember- no scar   TUBAL LIGATION      OB History     Gravida  4   Para  2   Term  2   Preterm      AB  2   Living  2      SAB  2   IAB      Ectopic      Multiple      Live Births  2            Home Medications    Prior to Admission medications  Medication Sig Start Date End Date Taking? Authorizing Provider  albuterol  (PROVENTIL ) (2.5 MG/3ML) 0.083% nebulizer solution Take 3 mLs (2.5 mg total) by nebulization every 2 (two) hours as  needed for wheezing or shortness of breath. 12/14/24  Yes Pearlean Manus, MD  aspirin  81 MG tablet Take 1 tablet (81 mg total) by mouth daily with breakfast. 12/14/24  Yes Emokpae, Courage, MD  carbamide peroxide (DEBROX) 6.5 % OTIC solution Place 5 drops into the right ear 2 (two) times daily as needed. 12/24/24  Yes Stuart Vernell Norris, PA-C  chlorthalidone  (HYGROTON ) 25 MG tablet Take 0.5 tablets (12.5 mg total) by mouth daily. 12/14/24  Yes Emokpae, Courage, MD  Flaxseed, Linseed, (FLAX SEED OIL PO) Take by mouth.   Yes [provider]  gemfibrozil (LOPID) 600 MG tablet Take 600 mg by mouth 2 (two) times daily before a meal.   Yes [provider]  glipiZIDE (GLUCOTROL XL) 10 MG 24 hr tablet Take 10 mg by mouth in the morning and at bedtime.   Yes [provider]  guaiFENesin  (MUCINEX ) 600 MG 12 hr tablet Take 1 tablet (600 mg total) by mouth 2 (two) times daily. 12/14/24  Yes Pearlean Manus, MD  levothyroxine  (SYNTHROID , LEVOTHROID) 50 MCG tablet Take 50 mcg by mouth daily before breakfast.  03/10/15  Yes [provider]  lidocaine  (XYLOCAINE ) 2 % solution Use as  directed 10 mLs in the mouth or throat every 3 (three) hours as needed. Gargle and spit every 3 hours as needed for pain relief 12/24/24  Yes Stuart Vernell Norris, PA-C  LOSARTAN  POTASSIUM PO Take 100 mg by mouth daily.   Yes [provider]  METOPROLOL  TARTRATE PO Take 25 mg by mouth in the morning and at bedtime.   Yes [provider]  nystatin  (MYCOSTATIN ) 100000 UNIT/ML suspension Take 5 mLs (500,000 Units total) by mouth 4 (four) times daily. 12/24/24  Yes Stuart Vernell Norris, PA-C  Omega-3 Fatty Acids (FISH OIL) 1000 MG CAPS Take 1,000 mg by mouth 2 (two) times daily.   Yes [provider]  pantoprazole  (PROTONIX ) 40 MG tablet Take 40 mg by mouth daily.  05/24/15  Yes [provider]  albuterol  (VENTOLIN  HFA) 108 (90 Base) MCG/ACT inhaler Inhale 2 puffs into the  lungs every 6 (six) hours as needed for wheezing or shortness of breath. 12/14/24   Pearlean Manus, MD  Nebulizers (COMPRESSOR/NEBULIZER) MISC 1 Units by Does not apply route daily as needed. 12/14/24   Pearlean Manus, MD  sodium bicarbonate  650 MG tablet Take 1 tablet (650 mg total) by mouth 2 (two) times daily. 12/15/24   Pearlean Manus, MD    Family History Family History  Problem Relation Age of Onset   Hypertension Father    Stroke Mother    Heart block Mother    Diabetes Brother    Hypertension Brother    Breast cancer Son    Breast cancer Paternal Aunt     Social History Social History[1]   Allergies   Amlodipine, Ciprofloxacin, Diltiazem, and Sulfa antibiotics   Review of Systems Review of Systems Per HPI  Physical Exam Triage Vital Signs ED Triage Vitals  Encounter Vitals Group     BP --      Girls Systolic BP Percentile --      Girls Diastolic BP Percentile --      Boys Systolic BP Percentile --      Boys Diastolic BP Percentile --      Pulse Rate 12/24/24 1217 68     Resp 12/24/24 1217 18     Temp 12/24/24 1217 97.7 F (36.5 C)     Temp Source 12/24/24 1217 Oral     SpO2 12/24/24 1217 94 %     Weight --      Height --      Head Circumference --      Peak Flow --      Pain Score 12/24/24 1213 0     Pain Loc --      Pain Education --      Exclude from Growth Chart --    No data found.  Updated Vital Signs Pulse 68   Temp 97.7 F (36.5 C) (Oral)   Resp 18   SpO2 94%   Visual Acuity Right Eye Distance:   Left Eye Distance:   Bilateral Distance:    Right Eye Near:   Left Eye Near:    Bilateral Near:     Physical Exam Vitals and nursing note reviewed.  Constitutional:      Appearance: Normal appearance. She is not ill-appearing.  HENT:     Head: Atraumatic.     Right Ear: There is impacted cerumen.     Mouth/Throat:     Mouth: Mucous membranes are moist.     Pharynx: Posterior oropharyngeal erythema present.     Comments: Patchy  exudates to  oropharynx and tongue.  Diffuse erythema to these areas.  Uvula midline, oral airway patent Eyes:     Extraocular Movements: Extraocular movements intact.     Conjunctiva/sclera: Conjunctivae normal.  Cardiovascular:     Rate and Rhythm: Normal rate.  Pulmonary:     Effort: Pulmonary effort is normal.     Breath sounds: Normal breath sounds. No wheezing or rales.  Musculoskeletal:        General: Normal range of motion.     Cervical back: Normal range of motion and neck supple.  Lymphadenopathy:     Cervical: No cervical adenopathy.  Skin:    General: Skin is warm and dry.  Neurological:     Mental Status: She is alert. Mental status is at baseline.  Psychiatric:        Mood and Affect: Mood normal.        Thought Content: Thought content normal.        Judgment: Judgment normal.      UC Treatments / Results  Labs (all labs ordered are listed, but only abnormal results are displayed) Labs Reviewed - No data to display  EKG   Radiology No results found.  Procedures Procedures (including critical care time)  Medications Ordered in UC Medications - No data to display  Initial Impression / Assessment and Plan / UC Course  I have reviewed the triage vital signs and the nursing notes.  Pertinent labs & imaging results that were available during my care of the patient were reviewed by me and considered in my medical decision making (see chart for details).     Treat oral thrush with nystatin  suspension, viscous lidocaine , probiotics.  Possibly secondary to recent antibiotic use.  Does have a cerumen impaction to the right ear, Debrox drops, warm water recommended.  Return for worsening symptoms.  Final Clinical Impressions(s) / UC Diagnoses   Final diagnoses:  Oral thrush  Right ear impacted cerumen   Discharge Instructions   None    ED Prescriptions     Medication Sig Dispense Auth. Provider   nystatin  (MYCOSTATIN ) 100000 UNIT/ML suspension Take  5 mLs (500,000 Units total) by mouth 4 (four) times daily. 180 mL Stuart Vernell Norris, PA-C   lidocaine  (XYLOCAINE ) 2 % solution Use as directed 10 mLs in the mouth or throat every 3 (three) hours as needed. Gargle and spit every 3 hours as needed for pain relief 100 mL Stuart Vernell Norris, PA-C   carbamide peroxide (DEBROX) 6.5 % OTIC solution Place 5 drops into the right ear 2 (two) times daily as needed. 15 mL Stuart Vernell Norris, [place]      PDMP not reviewed this encounter.    [1]  Social History Tobacco Use   Smoking status: Every Day    Current packs/day: 1.00    Average packs/day: 1 pack/day for 30.0 years (30.0 ttl pk-yrs)    Types: Cigarettes   Smokeless tobacco: Never  Vaping Use   Vaping status: Never Used  Substance Use Topics   Alcohol use: No    Alcohol/week: 0.0 standard drinks of alcohol   Drug use: No     Stuart Vernell Norris, PA-C 12/25/24 1712  "

## 2025-01-10 ENCOUNTER — Other Ambulatory Visit: Payer: Self-pay

## 2025-01-10 ENCOUNTER — Emergency Department (HOSPITAL_COMMUNITY)
Admission: EM | Admit: 2025-01-10 | Discharge: 2025-01-10 | Disposition: A | Discharge status: Home / Self Care | Attending: Emergency Medicine | Admitting: Emergency Medicine

## 2025-01-10 ENCOUNTER — Encounter (HOSPITAL_COMMUNITY): Payer: Self-pay | Admitting: Emergency Medicine

## 2025-01-10 DIAGNOSIS — I10 Essential (primary) hypertension: Secondary | ICD-10-CM | POA: Insufficient documentation

## 2025-01-10 DIAGNOSIS — R200 Anesthesia of skin: Secondary | ICD-10-CM | POA: Insufficient documentation

## 2025-01-10 DIAGNOSIS — Z0189 Encounter for other specified special examinations: Secondary | ICD-10-CM

## 2025-01-10 DIAGNOSIS — Z7982 Long term (current) use of aspirin: Secondary | ICD-10-CM | POA: Insufficient documentation

## 2025-01-10 LAB — COMPREHENSIVE METABOLIC PANEL WITH GFR
ALT: 15 U/L (ref 0–44)
AST: 23 U/L (ref 15–41)
Albumin: 3.8 g/dL (ref 3.5–5.0)
Alkaline Phosphatase: 110 U/L (ref 38–126)
Anion gap: 17 — ABNORMAL HIGH (ref 5–15)
BUN: 25 mg/dL — ABNORMAL HIGH (ref 8–23)
CO2: 22 mmol/L (ref 22–32)
Calcium: 9.7 mg/dL (ref 8.9–10.3)
Chloride: 100 mmol/L (ref 98–111)
Creatinine, Ser: 1.23 mg/dL — ABNORMAL HIGH (ref 0.44–1.00)
GFR, Estimated: 46 mL/min — ABNORMAL LOW
Glucose, Bld: 128 mg/dL — ABNORMAL HIGH (ref 70–99)
Potassium: 5 mmol/L (ref 3.5–5.1)
Sodium: 138 mmol/L (ref 135–145)
Total Bilirubin: <0.2 mg/dL (ref 0.0–1.2)
Total Protein: 6.7 g/dL (ref 6.5–8.1)

## 2025-01-10 LAB — CBC
HCT: 35 % — ABNORMAL LOW (ref 36.0–46.0)
Hemoglobin: 10.7 g/dL — ABNORMAL LOW (ref 12.0–15.0)
MCH: 28.5 pg (ref 26.0–34.0)
MCHC: 30.6 g/dL (ref 30.0–36.0)
MCV: 93.3 fL (ref 80.0–100.0)
Platelets: 411 10*3/uL — ABNORMAL HIGH (ref 150–400)
RBC: 3.75 MIL/uL — ABNORMAL LOW (ref 3.87–5.11)
RDW: 18.1 % — ABNORMAL HIGH (ref 11.5–15.5)
WBC: 6.5 10*3/uL (ref 4.0–10.5)
nRBC: 0 % (ref 0.0–0.2)

## 2025-01-10 LAB — CBG MONITORING, ED: Glucose-Capillary: 103 mg/dL — ABNORMAL HIGH (ref 70–99)

## 2025-01-10 NOTE — Discharge Instructions (Addendum)
 Your lab test today showed improved kidney function from 4 weeks ago, your potassium was normal, follow-up with your primary doctor.

## 2025-01-10 NOTE — ED Triage Notes (Signed)
 Pt to ER states she was seen at PCP today and was called and told her Potassium was 6.3.  Pt denies symptoms or concerns.

## 2025-01-10 NOTE — ED Provider Notes (Signed)
 " [place] EMERGENC[facility]PARTMENT AT [facility] Provider Note   CSN: [id number] Arrival date & time: 01/10/25  1631     Patient presents with: No chief complaint on file.   Gabrielle Clay is a 74 y.o. female.  Has history of dyslipidemia, hypertension, had admission for pneumonia and hypoxic respiratory failure on January 7 and was subsequent discharged on January.  Has been doing well since then aside from some persistent perioral numbness since her discharge that has been unchanged.  She had labs done at this visit and was called today and told that her potassium was high at 6.3 and to come to the ER for further evaluation.  HPI     Prior to Admission medications  Medication Sig Start Date End Date Taking? Authorizing Provider  albuterol  (PROVENTIL ) (2.5 MG/3ML) 0.083% nebulizer solution Take 3 mLs (2.5 mg total) by nebulization every 2 (two) hours as needed for wheezing or shortness of breath. 12/14/24   Pearlean Manus, MD  albuterol  (VENTOLIN  HFA) 108 (90 Base) MCG/ACT inhaler Inhale 2 puffs into the lungs every 6 (six) hours as needed for wheezing or shortness of breath. 12/14/24   Pearlean Manus, MD  aspirin  81 MG tablet Take 1 tablet (81 mg total) by mouth daily with breakfast. 12/14/24   Pearlean, Courage, MD  carbamide peroxide (DEBROX) 6.5 % OTIC solution Place 5 drops into the right ear 2 (two) times daily as needed. 12/24/24   Stuart Vernell Norris, PA-C  chlorthalidone  (HYGROTON ) 25 MG tablet Take 0.5 tablets (12.5 mg total) by mouth daily. 12/14/24   Emokpae, Courage, MD  Flaxseed, Linseed, (FLAX SEED OIL PO) Take by mouth.    [provider]  gemfibrozil (LOPID) 600 MG tablet Take 600 mg by mouth 2 (two) times daily before a meal.    [provider]  glipiZIDE (GLUCOTROL XL) 10 MG 24 hr tablet Take 10 mg by mouth in the morning and at bedtime.    [provider]  guaiFENesin  (MUCINEX ) 600 MG 12 hr tablet Take 1 tablet (600 mg total) by mouth  2 (two) times daily. 12/14/24   Pearlean Manus, MD  levothyroxine  (SYNTHROID , LEVOTHROID) 50 MCG tablet Take 50 mcg by mouth daily before breakfast.  03/10/15   [provider]  lidocaine  (XYLOCAINE ) 2 % solution Use as directed 10 mLs in the mouth or throat every 3 (three) hours as needed. Gargle and spit every 3 hours as needed for pain relief 12/24/24   Stuart Vernell Norris, PA-C  LOSARTAN  POTASSIUM PO Take 100 mg by mouth daily.    [provider]  METOPROLOL  TARTRATE PO Take 25 mg by mouth in the morning and at bedtime.    [provider]  Nebulizers (COMPRESSOR/NEBULIZER) MISC 1 Units by Does not apply route daily as needed. 12/14/24   Pearlean Manus, MD  nystatin  (MYCOSTATIN ) 100000 UNIT/ML suspension Take 5 mLs (500,000 Units total) by mouth 4 (four) times daily. 12/24/24   Stuart Vernell Norris, PA-C  Omega-3 Fatty Acids (FISH OIL) 1000 MG CAPS Take 1,000 mg by mouth 2 (two) times daily.    [provider]  pantoprazole  (PROTONIX ) 40 MG tablet Take 40 mg by mouth daily.  05/24/15   [provider]  sodium bicarbonate  650 MG tablet Take 1 tablet (650 mg total) by mouth 2 (two) times daily. 12/15/24   Pearlean Manus, MD    Allergies: Amlodipine, Ciprofloxacin, Diltiazem, and Sulfa antibiotics    Review of Systems  Updated Vital Signs BP ROLLEN)  147/70 (BP Location: Right Arm)   Pulse 79   Temp 98.3 F (36.8 C) (Oral)   Resp 16   Ht 4' 11 (1.499 m)   Wt 72.6 kg   SpO2 98%   BMI 32.32 kg/m   Physical Exam Vitals and nursing note reviewed.  Constitutional:      General: She is not in acute distress.    Appearance: She is well-developed.  HENT:     Head: Normocephalic and atraumatic.     Mouth/Throat:     Mouth: Mucous membranes are moist.  Eyes:     Extraocular Movements: Extraocular movements intact.     Conjunctiva/sclera: Conjunctivae normal.     Pupils: Pupils are equal, round, and reactive to light.  Cardiovascular:     Rate  and Rhythm: Normal rate and regular rhythm.     Heart sounds: No murmur heard. Pulmonary:     Effort: Pulmonary effort is normal. No respiratory distress.     Breath sounds: Normal breath sounds.  Abdominal:     Palpations: Abdomen is soft.     Tenderness: There is no abdominal tenderness.  Musculoskeletal:        General: No swelling.     Cervical back: Neck supple.  Skin:    General: Skin is warm and dry.     Capillary Refill: Capillary refill takes less than 2 seconds.  Neurological:     General: No focal deficit present.     Mental Status: She is alert and oriented to person, place, and time.  Psychiatric:        Mood and Affect: Mood normal.     (all labs ordered are listed, but only abnormal results are displayed) Labs Reviewed  CBC - Abnormal; Notable for the following components:      Result Value   RBC 3.75 (*)    Hemoglobin 10.7 (*)    HCT 35.0 (*)    RDW 18.1 (*)    Platelets 411 (*)    All other components within normal limits  COMPREHENSIVE METABOLIC PANEL WITH GFR - Abnormal; Notable for the following components:   Glucose, Bld 128 (*)    BUN 25 (*)    Creatinine, Ser 1.23 (*)    GFR, Estimated 46 (*)    Anion gap 17 (*)    All other components within normal limits  CBG MONITORING, ED - Abnormal; Notable for the following components:   Glucose-Capillary 103 (*)    All other components within normal limits    EKG: None  Radiology: No results found.   Procedures   Medications Ordered in the ED - No data to display                                  Medical Decision Making Differential diagnose includes but limited to AKI, hyperkalemia, lab error, other  ED course: Patient was sent in for evaluation for hyperkalemia based on labs drawn as outpatient, today her kidney function has improved from 4 weeks ago to 1.23, her potassium is within normal range at 5, her hemoglobin is stable.  She has no complaints outside of the ongoing perioral numbness  that has been ongoing for several weeks.  She did not follow-up with her primary care doctor for this, she was reassured that her labs did not show hyperkalemia today, given follow-up and return precautions.  Amount and/or Complexity of Data Reviewed Labs: ordered.  Final diagnoses:  None    ED Discharge Orders     None          Gabrielle Clay 01/10/25 AMOS Patsey Lot, MD 01/10/25 2256  "
